# Patient Record
Sex: Male | Born: 1960
Health system: Southern US, Community
[De-identification: ages and names within clinical notes are randomized; demographics above are authoritative.]

## PROBLEM LIST (undated history)

## (undated) DIAGNOSIS — M21379 Foot drop, unspecified foot: Secondary | ICD-10-CM

## (undated) HISTORY — DX: Foot drop, unspecified foot: M21.379

## (undated) HISTORY — PX: LEG SURGERY: SHX1003

## (undated) HISTORY — PX: FINGER SURGERY: SHX640

---

## 2005-12-19 ENCOUNTER — Emergency Department: Payer: Self-pay | Admitting: Emergency Medicine

## 2008-01-05 ENCOUNTER — Ambulatory Visit (HOSPITAL_BASED_OUTPATIENT_CLINIC_OR_DEPARTMENT_OTHER): Admission: RE | Admit: 2008-01-05 | Discharge: 2008-01-05 | Payer: Self-pay | Admitting: Orthopedic Surgery

## 2008-03-05 ENCOUNTER — Emergency Department (HOSPITAL_COMMUNITY): Admission: EM | Admit: 2008-03-05 | Discharge: 2008-03-05 | Payer: Self-pay | Admitting: *Deleted

## 2008-10-21 ENCOUNTER — Ambulatory Visit (HOSPITAL_COMMUNITY): Admission: RE | Admit: 2008-10-21 | Discharge: 2008-10-21 | Payer: Self-pay | Admitting: Neurology

## 2008-10-27 ENCOUNTER — Ambulatory Visit (HOSPITAL_COMMUNITY): Admission: RE | Admit: 2008-10-27 | Discharge: 2008-10-27 | Payer: Self-pay | Admitting: Neurology

## 2008-11-16 ENCOUNTER — Encounter: Admission: RE | Admit: 2008-11-16 | Discharge: 2008-11-16 | Payer: Self-pay | Admitting: Neurology

## 2010-10-16 NOTE — Op Note (Signed)
NAMEROCK, SOBOL                ACCOUNT NO.:  [id number]   MEDICAL RECORD NO.:  [id number]          PATIENT TYPE:  AMB   LOCATION:  DSC                          FACILITY:  [facility]   PHYSICIAN:  Katy Fitch. Sypher, M.D. 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   OPERATION:  Attempted closed reduction and intramedullary fixation of  left long finger distal phalanx mal nonunion followed by open reduction  and internal fixation with intramedullary Micro Acutrak screw.   SURGEON:  Katy Fitch. Sypher, MD   ASSISTANT:  Marveen Reeks Dasnoit, PA-C   ANESTHESIA:  General by LMA.   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   On Oct 27, 2007, he sustained a crushing injury to his left long finger  creating a diaphyseal transverse fracture of the distal phalanx.   He initially did not seek medical attention and attempted home splinting  for approximately 3 weeks.  Mr. Imes constructed a very creative  splint out of Popsicle sticks and duct tape which while immobilizing his  distal phalangeal segment did not obtain or maintain a reduction of the  fracture.   He subsequently presented for evaluation of his finger predicament on  November 18, 2007.  At that time, he was noted to have false motion at the  fracture site, some callus forming, and a bayonet apposition of his  fracture.   I had a lengthy informed consent  with Mr. Dase pointing out that  diaphyseal fracture of the distal phalanx if they are significantly  displaced at times will go onto a fibrous union, nonunion or difficult  malunion.  We embarked on a plan of observing his fracture for 8 weeks  with splinting to see if they would ultimately heal.   He returned on December 16, 2007, with persistent false motion and some  discomfort.  Given this predicament and his need to be quite hands-on at  work, we decided to proceed with either closed or open reduction and  intramedullary fixation anticipating use of a Micro Acutrak screw.   Mr. Ing was advised of the potential risks and benefits of surgery.  We advised him that if we had to open the fracture, we would approach it  from a volar midline so as to minimize any neurovascular injury or nail  injury.  Also pointed out that we would attempt a percutaneous  reduction, however, with 9 weeks of healing of pseudarthrosis we may be  challenged to correct the bayonet apposition.   After informed consent, he was brought to the operating room at this  time.   PROCEDURE:  Tage Feggins is brought to the operating room and placed in  supine position on the operating table.  Following an anesthesia consult  with Dr. Sampson Goon, general anesthesia by LMA technique was recommended  and accepted.   Mr. Spraggins was brought to room 6, placed in a supine position upon the  operating table and under Dr. Jarrett Ables direct supervision, general  anesthesia by LMA technique induced.  The left arm was prepped with  Betadine soap solution and sterilely draped.  Ancef 1 g was administered  as IV prophylactic antibiotic.   The procedure commenced with use of a C-arm fluoroscope to vigorously  manipulate the fracture.  While false motion was present, we are able to  create about 45 degrees of shotgun style angulation of the fracture in  an effort to walk the cortices back into alignment.   Due to a  pseudoarthrosis dorsally and the presence of an intact  fingernail, this was quite challenging to accomplish.   Ultimately, I attempted to use a 0.045-inch Kirschner wire  percutaneously to perform a corticotomy/callus release.  Ultimately  being unsuccessful to correct the translation, we performed a 1 cm  midline incision, placed retractors, released the periosteum and used a  3-mm wide osteotome to create a corticotomy and translation of the  fracture fragments.   A 0.035-inch Kirschner wire was driven through the distal phalangeal  fracture fragment and placed under direct vision across the fracture and  used to lever the intramedullary cortices into alignment.  This was then  driven across the DIP joint in neutral position followed by percutaneous  placement of a 16-mm Micro Acutrak screw.   Excellent compression of the fracture site was achieved and good  position of the fracture and intramedullary fixation device achieved.   AP and lateral C-arm images were obtained documenting satisfactory  reduction and fixation of the fracture.   The K-wire was removed followed by repair of the skin with simple suture  of 5-0 nylon.   A compressive supplied with Alumafoam splint to protect the DIP joint.   For aftercare, Mr. Harbaugh is provided a prescription for Keflex 500 mg  one p.o. q.8 hours x4 days as a prophylactic antibiotic and Percocet 5  mg one p.o. q.4-6 hours p.r.n. pain 30 tablets without refill.  He is  also provided a digital block of 2% lidocaine for immediate  postoperative analgesia.      Katy Fitch Sypher, M.D.  Electronically Signed     RVS/MEDQ  D:  01/05/2008  T:  01/05/2008  Job:  [id number]

## 2011-03-01 LAB — POCT HEMOGLOBIN-HEMACUE: Hemoglobin: 15.2

## 2011-03-05 LAB — DIFFERENTIAL
Basophils Absolute: 0
Basophils Relative: 1
Eosinophils Absolute: 0.4
Eosinophils Relative: 5
Lymphocytes Relative: 18
Lymphs Abs: 1.2
Monocytes Absolute: 0.8
Monocytes Relative: 11
Neutro Abs: 4.6
Neutrophils Relative %: 66

## 2011-03-05 LAB — COMPREHENSIVE METABOLIC PANEL
ALT: 26
AST: 20
Albumin: 3.3 — ABNORMAL LOW
Alkaline Phosphatase: 109
BUN: 18
CO2: 26
Calcium: 8.7
Chloride: 106
Creatinine, Ser: 0.85
GFR calc Af Amer: >60
GFR calc non Af Amer: >60
Glucose, Bld: 104 — ABNORMAL HIGH
Potassium: 3.5
Sodium: 139
Total Bilirubin: 0.9
Total Protein: 5.8 — ABNORMAL LOW

## 2011-03-05 LAB — CBC
HCT: 43.5
Hemoglobin: 14.9
MCHC: 34.3
MCV: 93.2
Platelets: 241
RBC: 4.66
RDW: 12.8
WBC: 7

## 2011-03-05 LAB — POCT CARDIAC MARKERS
CKMB, poc: 1.8
Myoglobin, poc: 47
Troponin i, poc: <0.05

## 2011-03-05 LAB — LIPASE, BLOOD: Lipase: 20

## 2014-05-31 ENCOUNTER — Encounter: Payer: Self-pay | Admitting: Podiatry

## 2014-05-31 ENCOUNTER — Ambulatory Visit (INDEPENDENT_AMBULATORY_CARE_PROVIDER_SITE_OTHER): Payer: Federal, State, Local not specified - PPO

## 2014-05-31 ENCOUNTER — Ambulatory Visit (INDEPENDENT_AMBULATORY_CARE_PROVIDER_SITE_OTHER): Payer: Federal, State, Local not specified - PPO | Admitting: Podiatry

## 2014-05-31 VITALS — BP 132/86 | HR 60 | Resp 16 | Ht 70.0 in | Wt 200.0 lb

## 2014-05-31 DIAGNOSIS — M722 Plantar fascial fibromatosis: Secondary | ICD-10-CM

## 2014-05-31 DIAGNOSIS — M779 Enthesopathy, unspecified: Secondary | ICD-10-CM

## 2014-05-31 NOTE — Progress Notes (Signed)
   Subjective:    Patient ID: Dorrene GermanKevin Nanni, male    DOB: 03/25/1961, 53 y.o.   MRN: [id number]  HPI Comments: i need new orthotics. im not having any foot pain. im tired of taping mine together all the time. In the past i had plantar fasciitis in both feet. i wear my orthotics almost everyday.  Foot Pain      Review of Systems  All other systems reviewed and are negative.      Objective:   Physical Exam        Assessment & Plan:

## 2014-06-01 NOTE — Progress Notes (Signed)
Subjective:     Patient ID: Ralph GermanKevin Goucher, male   DOB: 08/13/1960, 53 y.o.   MRN: [id number]  HPI patient presents after not being seen for a number of years with chronic pain in both feet and long-term orthotic usage with orthotics which have lost the ability to hold his arch up properly   Review of Systems  All other systems reviewed and are negative.      Objective:   Physical Exam  Constitutional: He is oriented to person, place, and time.  Cardiovascular: Intact distal pulses.   Musculoskeletal: Normal range of motion.  Neurological: He is oriented to person, place, and time.  Skin: Skin is warm.  Nursing note and vitals reviewed.  neurovascular status found to be intact with muscle strength adequate and range of motion of the subtalar and midtarsal joint within normal limits. Patient is noted to have discomfort in the plantar feet bilateral that's moderate in intensity with no significant worsening of symptoms. Patient does have moderate depression of the arch upon weightbearing     Assessment:     Tendinitis of the chronic nature that is controlled well with structural orthotic treatment that he has not had fray extended period of time    Plan:     H&P and x-rays reviewed. Today I scanned for new orthotics and reviewed types that we will make him and also physical therapy and supportive shoe gear usage. Reappoint when orthotics returned

## 2014-07-01 ENCOUNTER — Ambulatory Visit: Payer: Federal, State, Local not specified - PPO

## 2015-01-11 ENCOUNTER — Other Ambulatory Visit
Admission: RE | Admit: 2015-01-11 | Discharge: 2015-01-11 | Disposition: A | Discharge status: Home / Self Care | Payer: Federal, State, Local not specified - PPO | Source: Other Acute Inpatient Hospital | Attending: Ophthalmology | Admitting: Ophthalmology

## 2015-01-11 DIAGNOSIS — H16001 Unspecified corneal ulcer, right eye: Secondary | ICD-10-CM | POA: Insufficient documentation

## 2015-01-14 LAB — EYE CULTURE

## 2015-02-02 LAB — CULTURE, FUNGUS WITHOUT SMEAR

## 2015-06-05 ENCOUNTER — Emergency Department
Admission: EM | Admit: 2015-06-05 | Discharge: 2015-06-06 | Disposition: A | Discharge status: Home / Self Care | Payer: Federal, State, Local not specified - PPO | Attending: Emergency Medicine | Admitting: Emergency Medicine

## 2015-06-05 ENCOUNTER — Emergency Department: Payer: Federal, State, Local not specified - PPO

## 2015-06-05 ENCOUNTER — Encounter: Payer: Self-pay | Admitting: Emergency Medicine

## 2015-06-05 DIAGNOSIS — K529 Noninfective gastroenteritis and colitis, unspecified: Secondary | ICD-10-CM | POA: Diagnosis not present

## 2015-06-05 DIAGNOSIS — R1033 Periumbilical pain: Secondary | ICD-10-CM | POA: Diagnosis present

## 2015-06-05 LAB — COMPREHENSIVE METABOLIC PANEL
ALT: 36 U/L (ref 17–63)
AST: 19 U/L (ref 15–41)
Albumin: 3.7 g/dL (ref 3.5–5.0)
Alkaline Phosphatase: 86 U/L (ref 38–126)
Anion gap: 6 (ref 5–15)
BUN: 24 mg/dL — ABNORMAL HIGH (ref 6–20)
CO2: 30 mmol/L (ref 22–32)
Calcium: 9 mg/dL (ref 8.9–10.3)
Chloride: 104 mmol/L (ref 101–111)
Creatinine, Ser: 1.07 mg/dL (ref 0.61–1.24)
GFR calc Af Amer: >60 mL/min (ref >60)
GFR calc non Af Amer: >60 mL/min (ref >60)
Glucose, Bld: 106 mg/dL — ABNORMAL HIGH (ref 65–99)
Potassium: 3.9 mmol/L (ref 3.5–5.1)
Sodium: 140 mmol/L (ref 135–145)
Total Bilirubin: 0.9 mg/dL (ref 0.3–1.2)
Total Protein: 7 g/dL (ref 6.5–8.1)

## 2015-06-05 LAB — CBC WITH DIFFERENTIAL/PLATELET
Basophils Absolute: 0.1 10*3/uL (ref 0–0.1)
Basophils Relative: 1 %
Eosinophils Absolute: 0.4 10*3/uL (ref 0–0.7)
Eosinophils Relative: 5 %
HCT: 45.4 % (ref 40.0–52.0)
Hemoglobin: 15.7 g/dL (ref 13.0–18.0)
Lymphocytes Relative: 22 %
Lymphs Abs: 1.9 10*3/uL (ref 1.0–3.6)
MCH: 31.6 pg (ref 26.0–34.0)
MCHC: 34.6 g/dL (ref 32.0–36.0)
MCV: 91.2 fL (ref 80.0–100.0)
Monocytes Absolute: 0.9 10*3/uL (ref 0.2–1.0)
Monocytes Relative: 11 %
Neutro Abs: 5.5 10*3/uL (ref 1.4–6.5)
Neutrophils Relative %: 61 %
Platelets: 253 10*3/uL (ref 150–440)
RBC: 4.98 MIL/uL (ref 4.40–5.90)
RDW: 13.4 % (ref 11.5–14.5)
WBC: 8.8 10*3/uL (ref 3.8–10.6)

## 2015-06-05 LAB — LIPASE, BLOOD: Lipase: 22 U/L (ref 11–51)

## 2015-06-05 MED ORDER — PROMETHAZINE HCL 25 MG PO TABS: 25.0000 mg | ORAL_TABLET | Freq: Four times a day (QID) | ORAL | Status: DC | PRN

## 2015-06-05 MED ORDER — IOHEXOL 240 MG/ML SOLN
25.0000 mL | Freq: Once | INTRAMUSCULAR | Status: AC | PRN
  Administered 2015-06-05: 25 mL via ORAL

## 2015-06-05 MED ORDER — DICYCLOMINE HCL 20 MG PO TABS: 20.0000 mg | ORAL_TABLET | Freq: Three times a day (TID) | ORAL | Status: DC | PRN

## 2015-06-05 MED ORDER — IOHEXOL 300 MG/ML  SOLN
100.0000 mL | Freq: Once | INTRAMUSCULAR | Status: AC | PRN
  Administered 2015-06-05: 100 mL via INTRAVENOUS

## 2015-06-05 MED ORDER — SODIUM CHLORIDE 0.9 % IV BOLUS (SEPSIS)
500.0000 mL | Freq: Once | INTRAVENOUS | Status: AC
  Administered 2015-06-05: 500 mL via INTRAVENOUS

## 2015-06-05 NOTE — ED Notes (Signed)
Abdominal pain x 4 days.  Pain has been intermittent.  C/o Mid abdominal pain.

## 2015-06-05 NOTE — ED Provider Notes (Addendum)
Time Seen: Approximately 2150  I have reviewed the triage notes  Chief Complaint: Abdominal Pain   History of Present Illness: Ralph GermanKevin Jones is a 55 y.o. male who states he's had a 4 day history of crampy abdominal pain and points mainly superior to the umbilicus level. He states he's had some decreased appetite and occasional nausea with no vomiting. He is not aware of any obvious fever at home but states he's been traveling and was concerned that he may have appendicitis, food poisoning, etc. Denies any continuation of his pain and describes it as crampy intermittent. He states he had some night sweats but no fever that he is aware of. He describes frequent loose watery stool. He denies any melena or hematochezia. He is not aware of any obvious foodborne exposure. He denies being on any recent antibiotics.   History reviewed. No pertinent past medical history.  There are no active problems to display for this patient.   History reviewed. No pertinent past surgical history.  History reviewed. No pertinent past surgical history.  No current outpatient prescriptions on file.  Allergies:  Review of patient's allergies indicates no known allergies.  Family History: No family history on file.  Social History: Social History  Substance Use Topics  . Smoking status: Never Smoker   . Smokeless tobacco: None  . Alcohol Use: 0.0 oz/week    0 Standard drinks or equivalent per week     Review of Systems:   10 point review of systems was performed and was otherwise negative:  Constitutional: No fever Eyes: No visual disturbances ENT: No sore throat, ear pain Cardiac: No chest pain Respiratory: No shortness of breath, wheezing, or stridor Abdomen: Crampy periumbilical abdominal pain without radiation, no vomiting, diarrhea mentioned above Endocrine: No weight loss, No night sweats Extremities: No peripheral edema, cyanosis Skin: No rashes, easy bruising Neurologic: No focal  weakness, trouble with speech or swollowing Urologic: No dysuria, Hematuria, or urinary frequency   Physical Exam:  ED Triage Vitals  Enc Vitals Group     BP 06/05/15 1855 157/102 mmHg     Pulse Rate 06/05/15 1855 64     Resp 06/05/15 1855 18     Temp 06/05/15 1855 98.1 F (36.7 C)     Temp Source 06/05/15 1855 Oral     SpO2 06/05/15 1855 98 %     Weight 06/05/15 1855 200 lb (90.719 kg)     Height 06/05/15 1855 5\' 10"  (1.778 m)     Head Cir --      Peak Flow --      Pain Score 06/05/15 1856 6     Pain Loc --      Pain Edu? --      Excl. in GC? --     General: Awake , Alert , and Oriented times 3; GCS 15 Head: Normal cephalic , atraumatic Eyes: Pupils equal , round, reactive to light Nose/Throat: No nasal drainage, patent upper airway without erythema or exudate.  Neck: Supple, Full range of motion, No anterior adenopathy or palpable thyroid masses Lungs: Clear to ascultation without wheezes , rhonchi, or rales Heart: Regular rate, regular rhythm without murmurs , gallops , or rubs Abdomen: Tenderness mainly around the paraumbilical area without rebound, guarding , or rigidity; bowel sounds positive and symmetric in all 4 quadrants. No organomegaly .    Negative Murphy's sign, negative tenderness over McBurney's point    Extremities: 2 plus symmetric pulses. No edema, clubbing or cyanosis Neurologic: normal  ambulation, Motor symmetric without deficits, sensory intact Skin: warm, dry, no rashes   Labs:   All laboratory work was reviewed including any pertinent negatives or positives listed below:  Labs Reviewed  COMPREHENSIVE METABOLIC PANEL - Abnormal; Notable for the following:    Glucose, Bld 106 (*)    BUN 24 (*)    All other components within normal limits  CBC WITH DIFFERENTIAL/PLATELET  LIPASE, BLOOD   reviewed the patient's laboratory work appears to be a normal limits.   Radiology:  CT of the abdomen is pending at this time to rule out surgical causes for  diverticulitis. I personally reviewed the radiologic studies    ED Course:  Differential diagnosis includes but is not exclusive to acute appendicitis, renal colic, testicular torsion, urinary tract infection, prostatitis,  diverticulitis, small bowel obstruction, colitis, abdominal aortic aneurysm, gastroenteritis, etc.  If the patient's CT is negative as I suspect it will be the patient will be treated on an outpatient basis for viral gastroenteritis with Zofran and Bentyl. Patient's case will be discussed with my colleague who will disposition appropriately and follow results of the CAT scan.    Assessment: Acute unspecified abdominal pain   Final Clinical Impression:  Acute unspecified abdominal pain Final diagnoses:  None     Plan:  Outpatient management if CT is negative   Patient's CAT scan was read as enteritisEXAM: CT ABDOMEN AND PELVIS WITH CONTRAST  TECHNIQUE: Multidetector CT imaging of the abdomen and pelvis was performed using the standard protocol following bolus administration of intravenous contrast.  CONTRAST: OMNIPAQUE IOHEXOL 300 MG/ML SOLN  COMPARISON: None.  FINDINGS: Lower chest: The included lung bases are clear. Tiny calcified granulomas within both lung bases. Calcified mediastinal and hilar adenopathy, partially included.  Liver: No focal lesion.  Hepatobiliary: Gallbladder physiologically distended, no calcified gallstone. No biliary dilatation.  Pancreas: No ductal dilatation or inflammation.  Spleen: Punctate splenic granulomas. Upper limits of normal in size.  Adrenal glands: No nodule.  Kidneys: Symmetric renal enhancement and excretion. No hydronephrosis. Scattered cortical hypodensities in both kidneys, subcentimeter in size, too small to characterize.  Stomach/Bowel: Stomach mildly distended with ingested contrast and contents. Proximal small bowel loops are decompressed. The distal small bowel loops are dilated  and fluid-filled. Mild enhancement of right lower quadrant small bowel loops with mesenteric edema. Small mesenteric lymph nodes. No mesenteric swirling. No pneumatosis. The terminal ileum is difficult to accurately define, however does not appear inflamed. Appendix not confidently identified. Distal colon is decompressed, small amount of stool in the proximal colon.  Vascular/Lymphatic: No retroperitoneal adenopathy. Abdominal aorta is normal in caliber. Mild atherosclerosis without aneurysm.  Reproductive: Prostate gland normal in size  Bladder: Physiologically distended, no wall thickening.  Other: No free air. Small amount of free fluid in the pelvis and right pericolic gutter. No abscess or loculated fluid collection. Fat within both inguinal canals.  Musculoskeletal: There are no acute or suspicious osseous abnormalities.  IMPRESSION: 1. Distended fluid-filled small bowel loops in the right lower abdomen with adjacent mesenteric edema and small lymph nodes. Findings suggest enteritis, may be infectious or inflammatory. No transition point is seen. Early small bowel obstruction could have a similar appearance, but is felt less likely. 2. Sequela of granulomatous disease in the lung bases and spleen.   Electronically Signed  I discharged the patient with a prescription for Bentyl and Zofran.       Jennye Moccasin, MD 06/05/15 1610  Jennye Moccasin, MD 06/05/15 [phone]

## 2015-06-05 NOTE — ED Notes (Signed)
Patient transported to CT 

## 2016-02-08 IMAGING — CT CT ABD-PELV W/ CM
1 of 3 series · 13 of 32 positions shown, 18 images · IV contrast (omnipaque)
Comparison: None.

CLINICAL DATA: Intermittent abdominal pain for 4 days. Diffuse and
mid abdominal pain.

EXAM:
CT ABDOMEN AND PELVIS WITH CONTRAST
TECHNIQUE: Multidetector CT imaging of the abdomen and pelvis was performed
using the standard protocol following bolus administration of
intravenous contrast.
CONTRAST:  100mL OMNIPAQUE IOHEXOL 300 MG/ML  SOLN

[Series 2: routine abd pel with · axial · 0.89mm/px · z∈[-696,-236]mm · 13 of 104 slices shown, 18 images]
[im 6/104  soft-tissue]
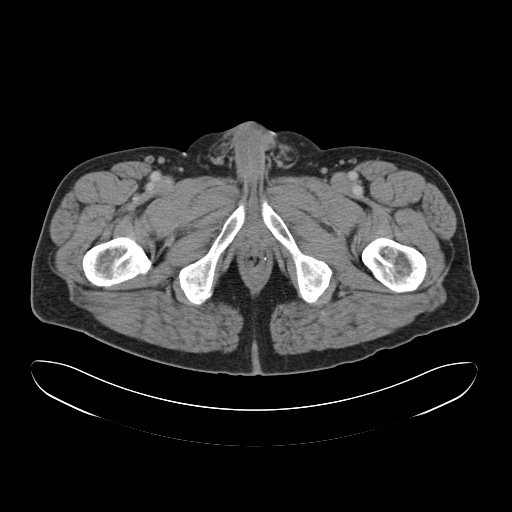
[im 6/104  bone]
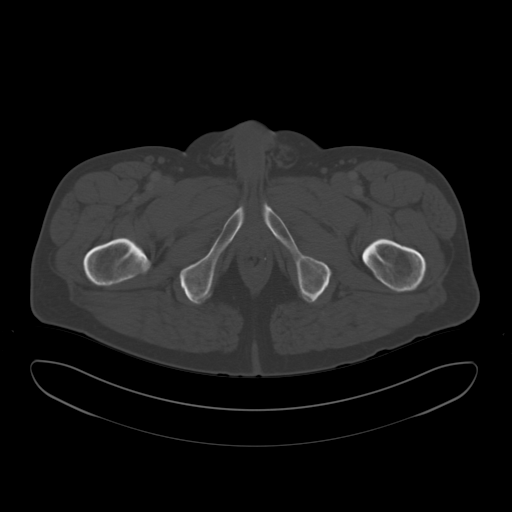
[im 17/104  soft-tissue]
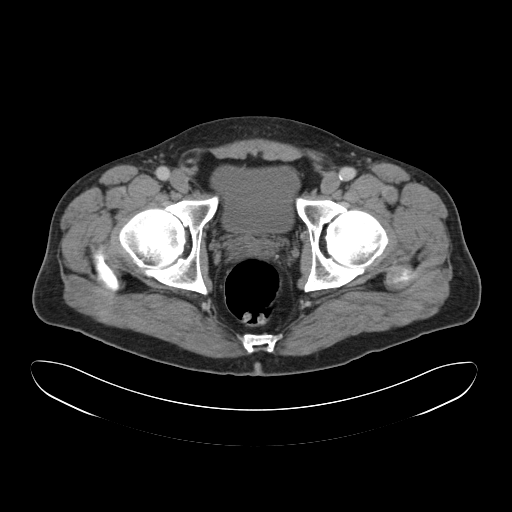
[im 22/104  soft-tissue]
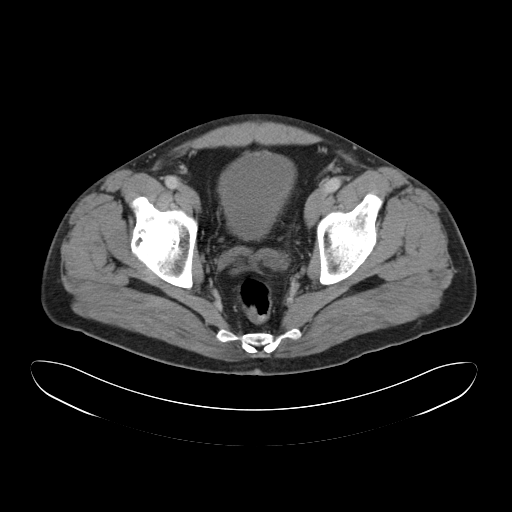
[im 33/104  soft-tissue]
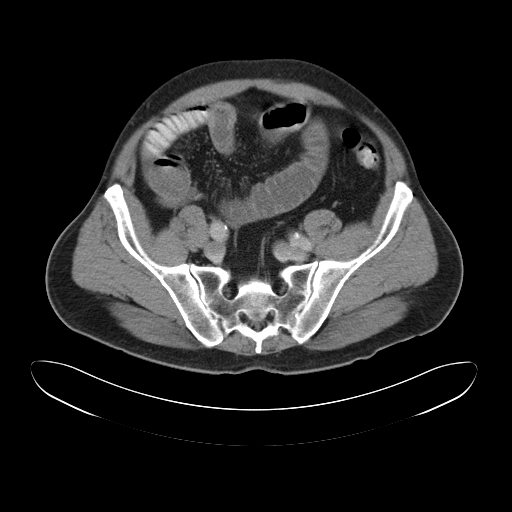
[im 38/104  soft-tissue]
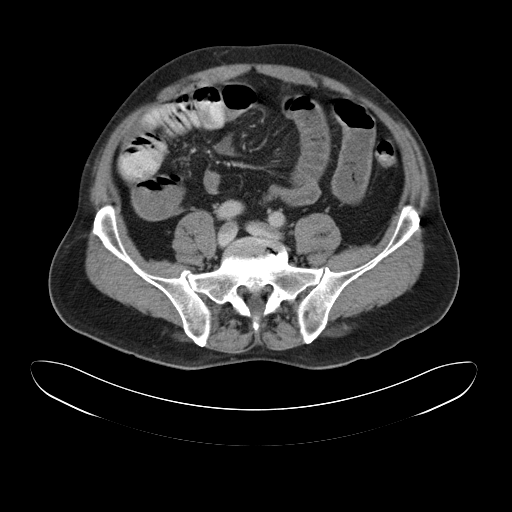
[im 49/104  soft-tissue]
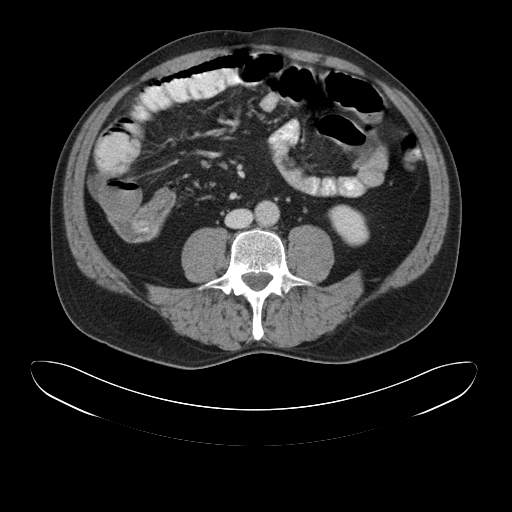
[im 55/104  soft-tissue]
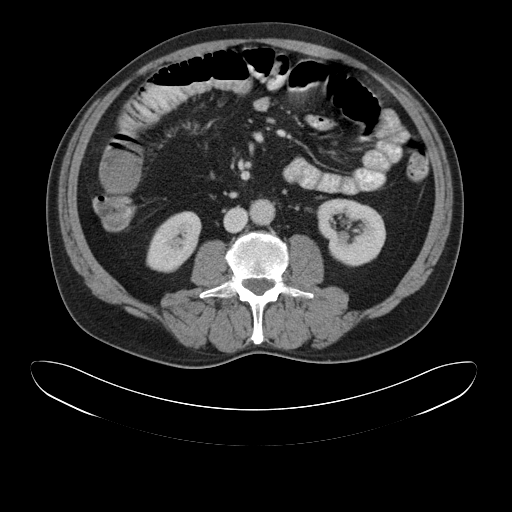
[im 66/104  soft-tissue]
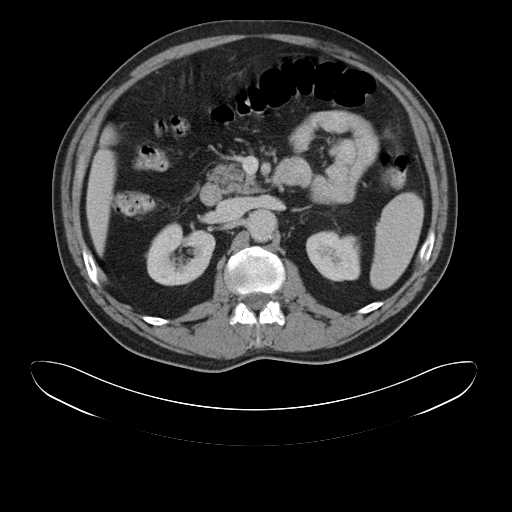
[im 71/104  soft-tissue]
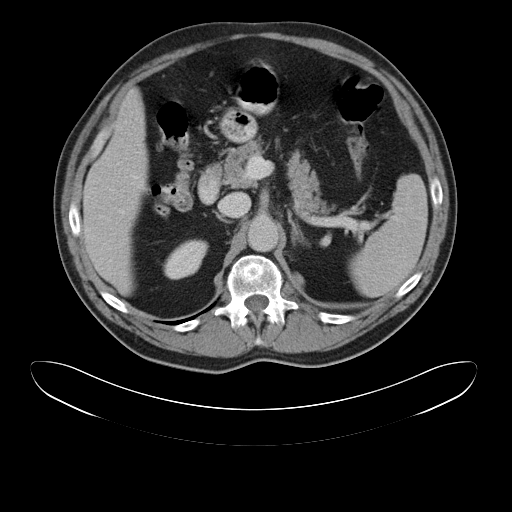
[im 71/104  bone]
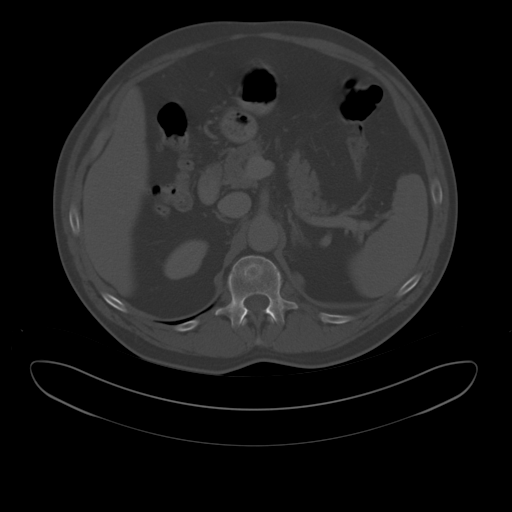
[im 82/104  soft-tissue]
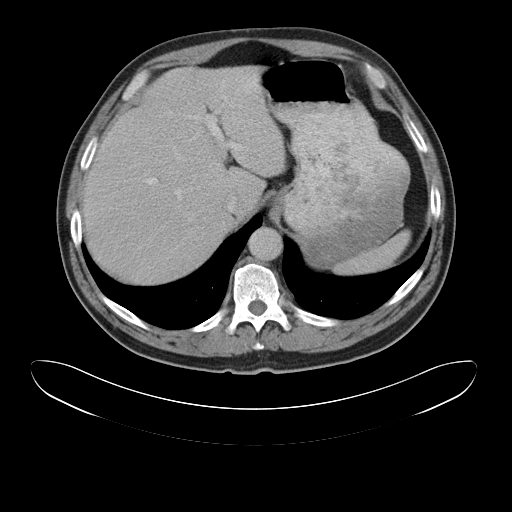
[im 82/104  lung]
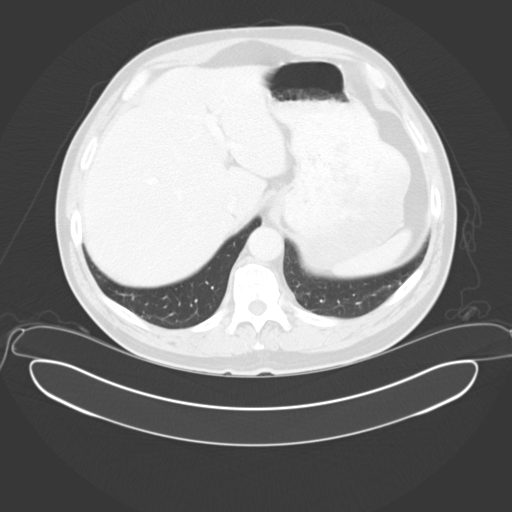
[im 87/104  soft-tissue]
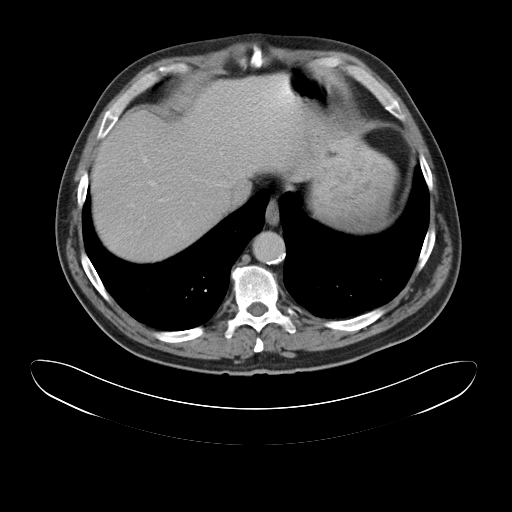
[im 87/104  lung]
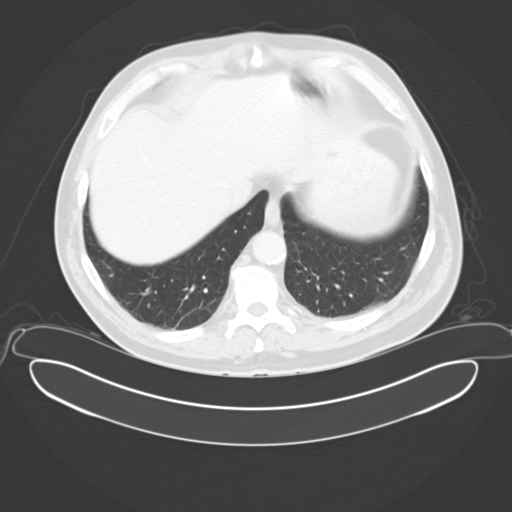
[im 93/104  lung]
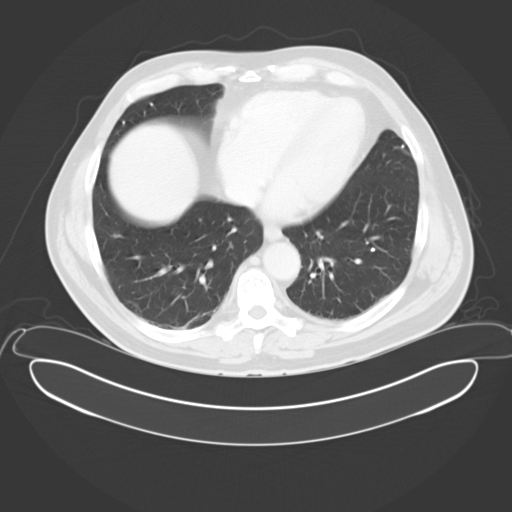
[im 98/104  soft-tissue]
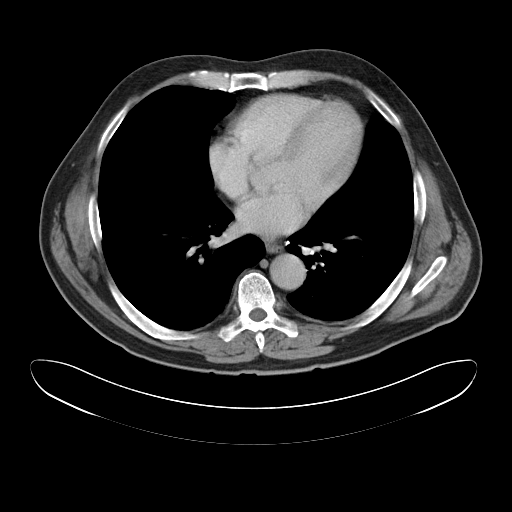
[im 98/104  lung]
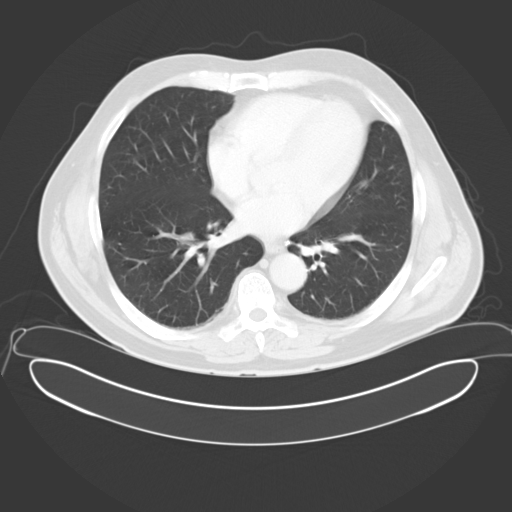

[13 of 32 positions shown; findings below may reference images not displayed]

FINDINGS: Lower chest: The included lung bases are clear. Tiny calcified
granulomas within both lung bases. Calcified mediastinal and hilar
adenopathy, partially included.

Liver: No focal lesion.

Hepatobiliary: Gallbladder physiologically distended, no calcified
gallstone. No biliary dilatation.

Pancreas: No ductal dilatation or inflammation.

Spleen: Punctate splenic granulomas. Upper limits of normal in size.

Adrenal glands: No nodule.

Kidneys: Symmetric renal enhancement and excretion. No
hydronephrosis. Scattered cortical hypodensities in both kidneys,
subcentimeter in size, too small to characterize.

Stomach/Bowel: Stomach mildly distended with ingested contrast and
contents. Proximal small bowel loops are decompressed. The distal
small bowel loops are dilated and fluid-filled. Mild enhancement of
right lower quadrant small bowel loops with mesenteric edema. Small
mesenteric lymph nodes. No mesenteric swirling. No pneumatosis. The
terminal ileum is difficult to accurately define, however does not
appear inflamed. Appendix not confidently identified. Distal colon
is decompressed, small amount of stool in the proximal colon.

Vascular/Lymphatic: No retroperitoneal adenopathy. Abdominal aorta
is normal in caliber. Mild atherosclerosis without aneurysm.

Reproductive: Prostate gland normal in size

Bladder: Physiologically distended, no wall thickening.

Other: No free air. Small amount of free fluid in the pelvis and
right pericolic gutter. No abscess or loculated fluid collection.
Fat within both inguinal canals.

Musculoskeletal: There are no acute or suspicious osseous
abnormalities.
IMPRESSION: 1. Distended fluid-filled small bowel loops in the right lower
abdomen with adjacent mesenteric edema and small lymph nodes.
Findings suggest enteritis, may be infectious or inflammatory. No
transition point is seen. Early small bowel obstruction could have a
similar appearance, but is felt less likely.
2. Sequela of granulomatous disease in the lung bases and spleen.

## 2016-06-17 DIAGNOSIS — K080 Exfoliation of teeth due to systemic causes: Secondary | ICD-10-CM | POA: Diagnosis not present

## 2017-02-04 ENCOUNTER — Encounter (HOSPITAL_COMMUNITY): Payer: Self-pay | Admitting: *Deleted

## 2017-02-04 ENCOUNTER — Ambulatory Visit (HOSPITAL_COMMUNITY)
Admission: EM | Admit: 2017-02-04 | Discharge: 2017-02-04 | Disposition: A | Discharge status: Home / Self Care | Payer: Federal, State, Local not specified - PPO | Attending: Family Medicine | Admitting: Family Medicine

## 2017-02-04 DIAGNOSIS — B880 Other acariasis: Secondary | ICD-10-CM

## 2017-02-04 MED ORDER — PREDNISONE 10 MG (21) PO TBPK: ORAL_TABLET | ORAL | 0 refills | Status: DC

## 2017-02-04 NOTE — ED Triage Notes (Addendum)
Rash  Pt  Developed  A  Rash    While    ouit tside  The  Rash is  Mainly  On his  Legs  The  Rash  Itches  The  Area  Is   Red    With     Lesions   He  Displays  No  Angioedema   And  Is   Sitting  Upright  Speaking in  Complete  sentances

## 2017-02-05 NOTE — ED Provider Notes (Signed)
  [facility]   [id number] 02/04/17 Arrival Time: 1819  ASSESSMENT & PLAN:  1. Chigger bites     Meds ordered this encounter  Medications  . predniSONE (STERAPRED UNI-PAK 21 TAB) 10 MG (21) TBPK tablet    Sig: Take as directed.    Dispense:  21 tablet    Refill:  0   Benadryl if needed. F/U as needed. Reviewed expectations re: course of current medical issues. Questions answered. Outlined signs and symptoms indicating need for more acute intervention. Patient verbalized understanding. After Visit Summary given.   SUBJECTIVE:  Ralph Jones is a 56 y.o. male who presents with complaint of itchy rash on LE bilaterally. Noticed yesterday after being outside in the woods. Afebrile. OTC creams without relief. Questions poison ivy. No specific aggravating or alleviating factors reported.  ROS: As per HPI.   OBJECTIVE:  Vitals:   02/04/17 1922  BP: 132/70  Pulse: 72  Resp: 18  Temp: 98.6 F (37 C)  TempSrc: Oral  SpO2: 100%    General appearance: alert; no distress Extremities: no cyanosis or edema; symmetrical with no gross deformities Skin: lower extremities with scattered erythematous papules; all solitary; no confluence Neurologic: normal gait; normal symmetric reflexes Psychological: alert and cooperative; normal mood and affect   No Known Allergies  Social History   Social History  . Marital status: Divorced    Spouse name: N/A  . Number of children: N/A  . Years of education: N/A   Occupational History  . Not on file.   Social History Main Topics  . Smoking status: Never Smoker  . Smokeless tobacco: Not on file  . Alcohol use 0.0 oz/week  . Drug use: Unknown  . Sexual activity: Not on file   Other Topics Concern  . Not on file   Social History Narrative  . No narrative on file      Ralph Jones, Ralph Firkus, MD 02/05/17 [phone]

## 2017-02-27 DIAGNOSIS — L980 Pyogenic granuloma: Secondary | ICD-10-CM | POA: Diagnosis not present

## 2017-02-27 DIAGNOSIS — D485 Neoplasm of uncertain behavior of skin: Secondary | ICD-10-CM | POA: Diagnosis not present

## 2017-02-27 DIAGNOSIS — R58 Hemorrhage, not elsewhere classified: Secondary | ICD-10-CM | POA: Diagnosis not present

## 2017-02-27 DIAGNOSIS — R234 Changes in skin texture: Secondary | ICD-10-CM | POA: Diagnosis not present

## 2017-03-07 DIAGNOSIS — L980 Pyogenic granuloma: Secondary | ICD-10-CM | POA: Diagnosis not present

## 2017-09-24 ENCOUNTER — Other Ambulatory Visit: Payer: Self-pay

## 2017-09-24 ENCOUNTER — Emergency Department (HOSPITAL_COMMUNITY)
Admission: EM | Admit: 2017-09-24 | Discharge: 2017-09-24 | Disposition: A | Discharge status: Home / Self Care | Attending: Emergency Medicine | Admitting: Emergency Medicine

## 2017-09-24 ENCOUNTER — Encounter (HOSPITAL_COMMUNITY): Payer: Self-pay | Admitting: Emergency Medicine

## 2017-09-24 DIAGNOSIS — S81831A Puncture wound without foreign body, right lower leg, initial encounter: Secondary | ICD-10-CM | POA: Diagnosis not present

## 2017-09-24 DIAGNOSIS — S61452A Open bite of left hand, initial encounter: Secondary | ICD-10-CM

## 2017-09-24 DIAGNOSIS — S61412A Laceration without foreign body of left hand, initial encounter: Secondary | ICD-10-CM | POA: Insufficient documentation

## 2017-09-24 DIAGNOSIS — S81851A Open bite, right lower leg, initial encounter: Secondary | ICD-10-CM

## 2017-09-24 DIAGNOSIS — W540XXA Bitten by dog, initial encounter: Secondary | ICD-10-CM | POA: Diagnosis not present

## 2017-09-24 DIAGNOSIS — Y9389 Activity, other specified: Secondary | ICD-10-CM | POA: Insufficient documentation

## 2017-09-24 DIAGNOSIS — Y990 Civilian activity done for income or pay: Secondary | ICD-10-CM | POA: Insufficient documentation

## 2017-09-24 DIAGNOSIS — S6982XA Other specified injuries of left wrist, hand and finger(s), initial encounter: Secondary | ICD-10-CM | POA: Diagnosis present

## 2017-09-24 DIAGNOSIS — Y9289 Other specified places as the place of occurrence of the external cause: Secondary | ICD-10-CM | POA: Insufficient documentation

## 2017-09-24 DIAGNOSIS — Z23 Encounter for immunization: Secondary | ICD-10-CM | POA: Insufficient documentation

## 2017-09-24 MED ORDER — AMOXICILLIN-POT CLAVULANATE 875-125 MG PO TABS
1.0000 | ORAL_TABLET | Freq: Once | ORAL | Status: AC
  Administered 2017-09-24: 1 via ORAL
  Filled 2017-09-24: qty 1

## 2017-09-24 MED ORDER — LIDOCAINE-EPINEPHRINE (PF) 2 %-1:200000 IJ SOLN
20.0000 mL | Freq: Once | INTRAMUSCULAR | Status: AC
  Administered 2017-09-24: 20 mL
  Filled 2017-09-24: qty 20

## 2017-09-24 MED ORDER — TETANUS-DIPHTH-ACELL PERTUSSIS 5-2.5-18.5 LF-MCG/0.5 IM SUSP
0.5000 mL | Freq: Once | INTRAMUSCULAR | Status: AC
  Administered 2017-09-24: 0.5 mL via INTRAMUSCULAR
  Filled 2017-09-24: qty 0.5

## 2017-09-24 MED ORDER — AMOXICILLIN-POT CLAVULANATE 875-125 MG PO TABS: 1.0000 | ORAL_TABLET | Freq: Two times a day (BID) | ORAL | 0 refills | Status: AC

## 2017-09-24 NOTE — ED Notes (Signed)
ED Provider at bedside. 

## 2017-09-24 NOTE — ED Triage Notes (Signed)
Pt reports being bitten by a dog today at 1400. Wounds to the left hand and right leg. Bleeding controlled at this time. Dogs vaccines up to date per pt.

## 2017-09-24 NOTE — Discharge Instructions (Addendum)
Thank you for allowing me to care for you today in the emergency department.  Please call Dr. Carlos LeveringGramig's office tomorrow morning to schedule a follow-up appointment for reevaluation within the next week.  If he is unable to see you within the next week, if you have a primary care provider you can call and schedule follow-up appointment with them for wound recheck in 2 to 3 days.  If you do not have a primary care provider, please call the number on your discharge paperwork to get established with someone new.  Your sutures need to be removed in 5 to 7 days.  You can have them removed at the hand surgeon's office, if you were able to be seen by your primary care provider, by returning to the emergency department, or if you can be taken out at urgent care.  Take 1 tablet of Augmentin every 12 hours for the next week.  Your first dose was given in the emergency department so start taking this medication tomorrow morning.  Your Tdap vaccination was also updated today.  Take 650 mg of Tylenol or 600 mg of ibuprofen with food every 6 hours for pain control.  Apply ice for 15 to 20 minutes to help with pain and swelling.  To care for your wound at home, clean the area at least once daily with warm water and soap.  It is okay to gently scrub the top of the hands and the lower leg.  After cleaning the area, pat the area dry, then apply a topical antibiotic such as bacitracin or Neosporin directly to the wound.  After applying a topical antibiotic, place a dressing over the area.   Large gauze banding with the brown coban wrap, you can also use a Band-Aid over the area.  If you were using a dressing that does not cover your entire hand, please use the finger splint on your ring finger so that you do not accidentally bend your finger and rip out your stitches.  Make sure to change the dressing at least once daily or anytime you notice that it is soiled or wet.  If you develop any new or worsening symptoms  including if the area around the wound gets red, hot to the touch, or swollen, if you develop fever or chills, red streaking down the arm, or other new concerning symptoms, return to the emergency department for re-evaluation.

## 2017-09-24 NOTE — ED Provider Notes (Signed)
[facility] EMERGENCY DEPARTMENT Provider Note   CSN: [id number] Arrival date & time: 09/24/17  1515     History   Chief Complaint Chief Complaint  Patient presents with  . Animal Bite    HPI Ralph Jones is a 57 y.o. male who presents to the emergency department with a chief complaint of dog bite.  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  The dog bit the dorsum of his left hand and distal right leg.  Animal control was contacted, and he was told that the dog's vaccinations were up-to-date.  He denies numbness, weakness.  Bleeding is controlled with pressure.  Patient is right hand dominant.  No history of left hand or right lower leg surgery or injury.  He is unsure when his tetanus was last updated.  He does not take blood thinners.  No history of diabetes or other immunocompromise exudates.  He is a non-smoker.  The history is provided by the patient. No language interpreter was used.  Animal Bite  Associated symptoms: no numbness and no rash     History reviewed. No pertinent past medical history.  There are no active problems to display for this patient.   History reviewed. No pertinent surgical history.      Home Medications    Prior to Admission medications   Medication Sig Start Date End Date Taking? Authorizing Provider  amoxicillin-clavulanate (AUGMENTIN) 875-125 MG tablet Take 1 tablet by mouth every 12 (twelve) hours for 7 days. 09/24/17 10/01/17  Danford Tat A, PA-C  dicyclomine (BENTYL) 20 MG tablet Take 1 tablet (20 mg total) by mouth 3 (three) times daily as needed for spasms. 06/05/15   Jennye Moccasin, MD  predniSONE (STERAPRED UNI-PAK 21 TAB) 10 MG (21) TBPK tablet Take as directed. 02/04/17   Mardella Layman, MD  promethazine (PHENERGAN) 25 MG tablet Take 1 tablet (25 mg total) by mouth every 6 (six) hours as needed for nausea or vomiting.  06/05/15   Jennye Moccasin, MD    Family History No family history on file.  Social History Social History   Tobacco Use  . Smoking status: Never Smoker  . Smokeless tobacco: Never Used  Substance Use Topics  . Alcohol use: Yes    Alcohol/week: 0.0 oz  . Drug use: Not on file     Allergies   Patient has no known allergies.   Review of Systems Review of Systems  Constitutional: Negative for activity change.  Respiratory: Negative for shortness of breath.   Cardiovascular: Negative for chest pain.  Gastrointestinal: Negative for abdominal pain.  Musculoskeletal: Positive for myalgias. Negative for arthralgias, back pain, gait problem and joint swelling.  Skin: Positive for wound. Negative for color change and rash.  Allergic/Immunologic: Negative for immunocompromised state.  Neurological: Negative for weakness and numbness.   Physical Exam Updated Vital Signs BP (!) 143/96 (BP Location: Right Arm)   Pulse 65   Temp 98.3 F (36.8 C) (Oral)   Resp 16   Ht 5\' 10"  (1.778 m)   Wt 90.7 kg (200 lb)   SpO2 100%   BMI 28.70 kg/m   Physical Exam  Constitutional: He appears well-developed.  HENT:  Head: Normocephalic.  Eyes: Conjunctivae are normal.  Neck: Neck supple.  Cardiovascular: Normal rate and regular rhythm.  No murmur heard. Pulmonary/Chest: Effort normal.  Abdominal: Soft. He exhibits no  distension.  Musculoskeletal: He exhibits tenderness. He exhibits no edema or deformity.  There is a superficial V-shaped with multiple corner flaps that is 3.5 cm in length the dorsum of the left hand that extends transversely across the fourth MCP and distally towards the wrist.  Wound is hemostatic when pressure is applied, but oozes when pressure is removed.  Radial pulses are 2+ and symmetric.  Patient has two-point discrimination along all 4 distal aspects of the third fourth and fifth digits.  Good capillary refill.  Good strength against resistance with flexion,  extension of the fourth digit.  Full active and passive range of motion of the fourth MCP, DIP, and PIP.  There is a punctate wound to the distal right lower leg that is superficial and hemostatic.  No surrounding erythema, edema, warmth.  Neurological: He is alert.  Skin: Skin is warm and dry.  Psychiatric: His behavior is normal.  Nursing note and vitals reviewed.  Left hand    Right distal anterior lower leg    Post initial repair- Most proximal ulnar/lateral suture ripped out  and was replaced with a horizontal mattress.       ED Treatments / Results  Labs (all labs ordered are listed, but only abnormal results are displayed) Labs Reviewed - No data to display  EKG None  Radiology No results found.  Procedures .Marland Kitchen.Laceration Repair Date/Time: 09/24/2017 5:48 PM Performed by: Barkley BoardsMcDonald, Carden Teel A, PA-C Authorized by: Barkley BoardsMcDonald, Falesha Schommer A, PA-C   Consent:    Consent obtained:  Verbal   Consent given by:  Patient   Risks discussed:  Infection, pain and poor wound healing Anesthesia (see MAR for exact dosages):    Anesthesia method:  Local infiltration   Local anesthetic:  Lidocaine 2% WITH epi Laceration details:    Location:  Hand   Hand location:  L hand, dorsum   Length (cm):  3.5 Repair type:    Repair type:  Intermediate Pre-procedure details:    Preparation:  Patient was prepped and draped in usual sterile fashion Exploration:    Hemostasis achieved with:  Direct pressure, epinephrine and tied off vessels   Wound exploration: wound explored through full range of motion and entire depth of wound probed and visualized     Wound extent: vascular damage     Wound extent: no areolar tissue violation noted, no foreign bodies/material noted, no muscle damage noted, no nerve damage noted, no tendon damage noted and no underlying fracture noted   Treatment:    Area cleansed with:  Betadine, saline, Shur-Clens and soap and water   Amount of cleaning:  Extensive    Irrigation solution:  Sterile saline   Irrigation volume:  1 liter    Irrigation method:  Pressure wash and syringe   Visualized foreign bodies/material removed: yes   Skin repair:    Repair method:  Sutures   Suture size:  5-0   Suture material:  Prolene   Number of sutures:  7 Approximation:    Approximation:  Loose Post-procedure details:    Dressing:  Antibiotic ointment, sterile dressing, adhesive bandage and splint for protection   Patient tolerance of procedure:  Tolerated well, no immediate complications Comments:     Patient required hemostasis with a figure 8 absorbable stitch.  6 simple interrupted sutures were initially placed, and a dressing was placed.  I was called back into the room by nursing staff because 1 of the sutures had ripped out when the patient and his finger.  1  simple interrupted suture was removed and was replaced with a horizontal mattress.  The wound was then hemostatic, a gauze dressing and a finger splint was then reapplied.   (including critical care time)  Medications Ordered in ED Medications  lidocaine-EPINEPHrine (XYLOCAINE W/EPI) 2 %-1:200000 (PF) injection 20 mL (20 mLs Infiltration Given 09/24/17 1810)  Tdap (BOOSTRIX) injection 0.5 mL (0.5 mLs Intramuscular Given 09/24/17 1809)  amoxicillin-clavulanate (AUGMENTIN) 875-125 MG per tablet 1 tablet (1 tablet Oral Given 09/24/17 1809)     Initial Impression / Assessment and Plan / ED Course  I have reviewed the triage vital signs and the nursing notes.  Pertinent labs & imaging results that were available during my care of the patient were reviewed by me and considered in my medical decision making (see chart for details).     Patient presents with laceration from a dog bite.  Pt wounds irrigated well with 18ga angiocath with sterile saline.  Wounds examined with visualization of the base and no foreign bodies seen.  Pt Alert and oriented, NAD, nontoxic, nonseptic appearing.  Capillary refill  intact and pt without neurologic deficit. Patient tetanus updated. Pain treated in the emergency department with lidocaine.  Wound was gaping and required closure.  Laceration occurred < 8 hours prior to repair which was well tolerated. Pt has no co morbidities to effect normal wound healing. Discussed suture home care w pt and answered questions. Pt to f-u for wound check and suture removal in 5-7 days. Pt is hemodynamically stable w no complaints prior to dc.    Final Clinical Impressions(s) / ED Diagnoses   Final diagnoses:  Dog bite of left hand, initial encounter  Laceration of left hand without foreign body, initial encounter  Dog bite of right lower leg, initial encounter    ED Discharge Orders        Ordered    amoxicillin-clavulanate (AUGMENTIN) 875-125 MG tablet  Every 12 hours     09/24/17 1751       Esmee Fallaw, Coral Else, PA-C 09/24/17 2009    Marily Memos, MD 09/24/17 2230

## 2017-10-07 ENCOUNTER — Ambulatory Visit: Payer: Federal, State, Local not specified - PPO | Admitting: Family Medicine

## 2017-10-07 ENCOUNTER — Encounter

## 2017-10-07 ENCOUNTER — Encounter: Payer: Self-pay | Admitting: Family Medicine

## 2017-10-07 VITALS — BP 102/68 | HR 64 | Temp 97.7°F | Ht 70.0 in | Wt 208.5 lb

## 2017-10-07 DIAGNOSIS — Z13220 Encounter for screening for lipoid disorders: Secondary | ICD-10-CM

## 2017-10-07 DIAGNOSIS — Z131 Encounter for screening for diabetes mellitus: Secondary | ICD-10-CM

## 2017-10-07 DIAGNOSIS — Z7189 Other specified counseling: Secondary | ICD-10-CM

## 2017-10-07 DIAGNOSIS — W540XXD Bitten by dog, subsequent encounter: Secondary | ICD-10-CM

## 2017-10-07 DIAGNOSIS — Z0000 Encounter for general adult medical examination without abnormal findings: Secondary | ICD-10-CM

## 2017-10-07 NOTE — Patient Instructions (Signed)
Fasting labs as some point, when possible.  I would get a flu shot each fall.   Take care.  Glad to see you.  Update me as needed.

## 2017-10-09 ENCOUNTER — Encounter: Payer: Self-pay | Admitting: Family Medicine

## 2017-10-09 DIAGNOSIS — Z0000 Encounter for general adult medical examination without abnormal findings: Secondary | ICD-10-CM | POA: Insufficient documentation

## 2017-10-09 DIAGNOSIS — Z7189 Other specified counseling: Secondary | ICD-10-CM | POA: Insufficient documentation

## 2017-10-09 DIAGNOSIS — W540XXA Bitten by dog, initial encounter: Secondary | ICD-10-CM | POA: Insufficient documentation

## 2017-10-09 NOTE — Progress Notes (Signed)
New patient.   CPE- See plan.  Routine anticipatory guidance given to patient.  See health maintenance.  The possibility exists that previously documented standard health maintenance information may have been brought forward from a previous encounter into this note.  If needed, that same information has been updated to reflect the current situation based on today's encounter.    Tetanus 2019 Flu encouraged.  PNA and shingles not due yet, d/w pt.  Healthy diet encouraged.  He is walking frequently for exercise.  He walks up to 8 miles per day with work. D/w patient [id number] for colon cancer screening, including IFOB vs. colonoscopy.  Risks and benefits of both were discussed and patient voiced understanding.  Pt elects to consider for now.  We talked about Cologuard also.  He will let me know.  See after visit summary. Prostate cancer screening and PSA options (with potential risks and benefits of testing vs not testing) were discussed along with recent recs/guidelines.  He declined testing PSA at this point. HIV and hepatitis C screening done at the [facility] in 2018 when he gave blood.  Discussed with patient. Advanced care planning.  Daughter Clarisse Gouge designated if patient were incapacitated. Reasonable to check sugar and lipids at some point, and fasting lab appointment.  D/w pt.    Recent dog bite.  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  He had emergency room treatment and then emergency room follow-up for suture removal.  He had a laceration on the left hand and also on the right shin.  Both are healing.  No fevers.  He is gradually getting his range of motion back on the left fourth finger.  Still bandaged today.  PMH and SH reviewed  Meds, vitals, and allergies reviewed.   ROS: Per HPI.  Unless specifically indicated otherwise in HPI, the patient denies:  General: fever. Eyes: acute vision changes ENT: sore throat Cardiovascular: chest  pain Respiratory: SOB GI: vomiting GU: dysuria Musculoskeletal: acute back pain Derm: acute rash Neuro: acute motor dysfunction Psych: worsening mood Endocrine: polydipsia Heme: bleeding Allergy: hayfever  GEN: nad, alert and oriented HEENT: mucous membranes moist NECK: supple w/o LA CV: rrr. PULM: ctab, no inc wob ABD: soft, +bs EXT: no edema SKIN: no acute rash L hand bandaged.  Abrasion on right shin healing with small scab noted. Distally neurovascularly intact on exam of the left hand.

## 2017-10-09 NOTE — Assessment & Plan Note (Addendum)
Tetanus 2019 Flu encouraged.  PNA and shingles not due yet, d/w pt.  Healthy diet encouraged.  He is walking frequently for exercise.  He walks up to 8 miles per day with work. D/w patient [id number] for colon cancer screening, including IFOB vs. colonoscopy.  Risks and benefits of both were discussed and patient voiced understanding.  Pt elects to consider for now.  We talked about Cologuard also.  He will let me know.  See after visit summary. Prostate cancer screening and PSA options (with potential risks and benefits of testing vs not testing) were discussed along with recent recs/guidelines.  He declined testing PSA at this point. HIV and hepatitis C screening done at the [facility] in 2018 when he gave blood.  Discussed with patient. Advanced care planning.  Daughter Ralph Jones designated if patient were incapacitated. Reasonable to check sugar and lipids at some point, and fasting lab appointment.  D/w pt.

## 2017-10-09 NOTE — Assessment & Plan Note (Signed)
Appears to be healing well.  No fevers.  Update me as needed.  Routine cautions given.  He agrees.

## 2017-10-09 NOTE — Assessment & Plan Note (Signed)
Advanced care planning.  Daughter Clarisse Gouge designated if patient were incapacitated.

## 2017-11-05 DIAGNOSIS — H43811 Vitreous degeneration, right eye: Secondary | ICD-10-CM | POA: Diagnosis not present

## 2018-02-16 DIAGNOSIS — H43811 Vitreous degeneration, right eye: Secondary | ICD-10-CM | POA: Diagnosis not present

## 2018-12-01 ENCOUNTER — Other Ambulatory Visit: Payer: Self-pay

## 2018-12-01 ENCOUNTER — Encounter: Payer: Self-pay | Admitting: Family Medicine

## 2018-12-01 ENCOUNTER — Ambulatory Visit: Payer: Federal, State, Local not specified - PPO | Admitting: Family Medicine

## 2018-12-01 ENCOUNTER — Telehealth: Payer: Self-pay | Admitting: Family Medicine

## 2018-12-01 VITALS — BP 130/66 | HR 60 | Temp 98.0°F | Ht 70.0 in | Wt 204.3 lb

## 2018-12-01 DIAGNOSIS — F4321 Adjustment disorder with depressed mood: Secondary | ICD-10-CM | POA: Insufficient documentation

## 2018-12-01 NOTE — Telephone Encounter (Signed)
Please triage patient. See below.  Thanks.   Colon Branch, MD  Tonia Ghent, MD; Colon Branch, MD        Good morning, your patient called the answering service last night, he reported depression and suicidal ideas, declined ER evaluation, information about the suicide prevention hotline provided. He said he will call your office this morning. Please follow-up

## 2018-12-01 NOTE — Telephone Encounter (Signed)
Noted. Thanks.

## 2018-12-01 NOTE — Telephone Encounter (Signed)
I spoke with pt; pt did not go to UC or ED last night; today pt said he feels tired and wants the stress to go away. Pt has had a lot on plate last 3 years. Tired of others taking advantage of him. He has to cut ties and that hurts. No SI/HI today. He knows "this boy" is going to be leaving today. Pt refusing ED and request appt to see Dr Damita Dunnings. Dr Damita Dunnings said could see pt today at 11:30 AM. Pt will be back from [place] today around 10 AM. ED precautions given and pt voiced understanding. No covid symptoms, no travel and no exposure to + covid. FYI to Dr Damita Dunnings.

## 2018-12-01 NOTE — Patient Instructions (Signed)
Update me after the counseling appointment.  I want to know how you are doing.  Take care.  Glad to see you.

## 2018-12-01 NOTE — Telephone Encounter (Signed)
[facility] - Client TELEPHONE ADVICE RECORD AccessNurse Patient Name: Ralph GermanKEVIN Jones Gender: Male DOB: 05/19/1961 Age: 58 Y 1 M 25 D Return Phone Number: [phone] (Primary) Address: [place]Zip: [place] [id number] Client [facility] - Client [facility] - Night Physician AA - PHYSICIAN, Crissie FiguresUNKNOWN- MD Contact Type Call Who Is Calling Patient / Member / Family / Caregiver Call Type Triage / Clinical Relationship To Patient Self Return Phone Number [phone] (Primary) Chief Complaint SUICIDE - threatening harm to self or others Reason for Call Symptomatic / Request for Health Information Initial Comment Caller states he wants to know if there is some anti-depressed medication he can take. Hes having thoughts about hurting himself or someone else. Translation No Nurse Assessment Nurse: Mauro KaufmannHalpin, RN, Randa EvensJacklyn Date/Time (Eastern Time): 11/30/2018 5:16:13 PM Confirm and document reason for call. If symptomatic, describe symptoms. ---Caller states that he is really going through a lot and needs help. Has been in bad relationships x3 years and has been dealing with extra pressure that has been overwhelming. Sometimes he doesn't feel like he wants to be alive. Wants to know if there are any antidepressant medications, maybe something mild that he can be on. He is tried of crying everyday and he is tired and drained from the stress. " thinking of going out and hanging myself". Has the patient had close contact with a person known or suspected to have the novel coronavirus illness OR traveled / lives in area with major community spread (including international travel) in the last 14 days from the onset of symptoms? * If Asymptomatic, screen for exposure and travel within the last 14 days. ---No Does the patient have any new or worsening symptoms? ---Yes Will a triage be completed?  ---Yes Related visit to physician within the last 2 weeks? ---No Does the PT have any chronic conditions? (i.e. diabetes, asthma, this includes High risk factors for pregnancy, etc.) ---No Is this a behavioral health or substance abuse call? ---Yes Are you having any thoughts or feelings of harming or killing yourself or someone else? ---Yes Do you have a weapon with you? ---No Are you alone? ---Yes Enter any comments. 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 PLEASE NOTE: All timestamps contained within this report are represented as [place] Standard Time. CONFIDENTIALTY NOTICE: This fax transmission is intended only for the addressee. It contains information that is legally privileged, confidential or otherwise protected from use or disclosure. If you are not the intended recipient, you are strictly prohibited from reviewing, disclosing, copying using or disseminating any of this information or taking any action in reliance on or regarding this information. If you have received this fax in error, please notify us immediately by telephone so that we can arrange for its return to us. Phone: [phone], Toll-Free: [phone], Fax: [phone] Page: 2 of 3 Call Id: [id number] Nurse Assessment Are you currently experiencing any physical discomfort that you think may be related to the use of alcohol or other drugs? (use substance abuse or alcohol abuse guidelines. These include withdrawal symptoms) ---No Do you worry that you may be hearing or seeing things that others do not? ---No Do you take medications for your condition(s)? ---No Guidelines Guideline Title Affirmed Question Affirmed Notes Nurse Date/Time (Eastern Time) Suicide Concerns [1] Depression symptoms (sadness, hopelessness, decreased energy) AND [2] unable to do any normal activities (e.g., self care, school, work; in comparison to baseline). Mauro KaufmannHalpin, RN, Randa EvensJacklyn 11/30/2018  5:24:08 PM Disp. Time Eilene Ghazi  Time) Disposition Final User 11/30/2018 5:12:19 PM Send to Urgent Gretchen Portela 11/30/2018 5:45:20 PM Called On-Call Provider Pole Ojea, RN, Malachi Paradise 11/30/2018 5:49:57 PM Paged On Call back to [facility], [place], Malachi Paradise 11/30/2018 5:51:02 PM Paged On Call back to [facility], [place], Malachi Paradise 11/30/2018 5:50:31 PM Go to ED Now Yes Freddie Apley, RN, Lavonna Monarch Disagree/Comply Disagree Caller Understands Yes PreDisposition InappropriateToAsk Care Advice Given Per Guideline GO TO ED NOW: * You need to be seen in the Emergency Department. * Go to the ED at ___________ [place] now. Drive carefully. CARE ADVICE given per Suicide Concerns (Adult) guideline. Referrals GO TO FACILITY UNDECIDED Paging DoctorName Phone DateTime Result/Outcome Message Type Notes Kathlene November - MD [id number] 11/30/2018 5:45:20 PM Called On Call Provider - Reached Doctor Paged Kathlene November - MD 11/30/2018 5:46:15 PM Spoke with On Call - General Message Result provider stated tht he was unable to find the patient in the system. The provider stated that he would need to see the patient in the ED or in clinic to perscirbs an anti depressant. PLEASE NOTE: All timestamps contained within this report are represented as [place] Standard Time. CONFIDENTIALTY NOTICE: This fax transmission is intended only for the addressee. It contains information that is legally privileged, confidential or otherwise protected from use or disclosure. If you are not the intended recipient, you are strictly prohibited from reviewing, disclosing, copying using or disseminating any of this information or taking any action in reliance on or regarding this information. If you have received this fax in error, please notify us immediately by telephone so that we can arrange for its return to [place]. Phone: [phone], Toll-Free: [phone], Fax: [phone] Page: 3 of 3 Call Id: [id number] [place] Phone DateTime  Result/Outcome Message Type Notes Kathlene November - MD [id number] 11/30/2018 5:49:56 PM Paged On Call Back to Call Center Doctor Paged [place]: [phone] EXT: [phone] I just called you regarding the patient who was saying he was going to commit suicide. He refused to be seen tonight, he said that he would call in the morning to [place]. I told him that he needed to take care of this tonight and he said he wouldn't harm himself tonight. His last name was misspelled, the correct spelling is Ellerbe. I gave him the information to the hot lines that you had given me. Kathlene November - MD 11/30/2018 6:30:25 PM Spoke with On Call - General Message Result

## 2018-12-01 NOTE — Progress Notes (Signed)
In a relationship for 3.5 years with his girlfriend.  She moved in with him prev.  They had different goals and they grew apart.  In the last 1.5 years "it's been a roller coaster."  He has asked her to move out multiple times, she left May 23rd of this year.  His girlfriend's son had an acquaintance from school (who immigrated from [place]) and he has been living with him.  That dependent is going to move in with another resettled refugee family.    He is trying to manage a lot of stressors.  He has tried to help his dependent as described above.  He is tearful about the whole situation.  All of this is on top of the pandemic.  He is still working.    When patient goes home, he'll be alone with his dog and cat.  No SI/HI.  He contracts for safety.    No illicits.  No etoh.  He is going to see a counselor tomorrow with his EAP.  He is going to follow through with that, I encouraged that.    He has plans to see his daughter and his grandkids this weekend, she is supportive.    Meds, vitals, and allergies reviewed.   ROS: Per HPI unless specifically indicated in ROS section   GEN: nad, alert and oriented HEENT: Normocephalic atraumatic NECK: supple w/o LA CV: rrr PULM: ctab, no inc wob ABD: soft, +bs EXT: no edema SKIN: Well-perfused Speech is fluent and judgment appears intact.  No tremor.

## 2018-12-02 ENCOUNTER — Telehealth: Payer: Self-pay | Admitting: Family Medicine

## 2018-12-02 NOTE — Assessment & Plan Note (Signed)
No suicidal or homicidal intent.  No illicit use.  Still okay for outpatient follow-up.  He has follow-up pending with counseling through EAP.  I think this is reasonable.  He was not yet at the point of wanting to start medication.  Discussed options.  I want him to get the counseling appointment and then let me know how he is doing.  We talked about options and he agrees with this plan.  Routine cautions given. >25 minutes spent in face to face time with patient, >50% spent in counselling or coordination of care.

## 2018-12-02 NOTE — Telephone Encounter (Signed)
Please get update on patient.  I want to know how he is doing after his appointment with counseling/EAP.  Thanks.

## 2018-12-03 ENCOUNTER — Ambulatory Visit: Payer: Federal, State, Local not specified - PPO | Admitting: Family Medicine

## 2018-12-03 NOTE — Telephone Encounter (Signed)
Patient says he hasn't had an appt with anybody, still trying to get something set up.  Patient says today is a pretty good day.

## 2018-12-05 NOTE — Telephone Encounter (Signed)
Noted.  Please check with patient at the end of the upcoming week, ie ~12/10/2018 about his mood and using EAP.  Thanks.

## 2018-12-11 NOTE — Telephone Encounter (Signed)
Spoke with patient. Patient states he is doing better. Tried to seek EAP counseling through work but everyone is booked up right now. Just working and keeping busy at this time.

## 2018-12-13 NOTE — Telephone Encounter (Signed)
Noted. Thanks.  Glad he is doing better.   

## 2020-06-26 ENCOUNTER — Telehealth: Payer: Self-pay

## 2020-06-26 NOTE — Telephone Encounter (Signed)
[facility] Night - Client TELEPHONE ADVICE RECORD AccessNurse Patient Name: Ralph Jones Gender: Male DOB: 1961-01-08 Age: 60 Y 8 M 18 D Return Phone Number: [phone] (Primary) Address: City/State/Zip: [place] [address] Client [employer] [facility] Night - Client Client Site [facility] - Night Physician Raechel Ache - MD Contact Type Call Who Is Calling Patient / Member / Family / Caregiver Call Type Triage / Clinical Relationship To Patient Self Return Phone Number [phone] (Primary) Chief Complaint Chest Pain (non urgent symptoms) Reason for Call Symptomatic / Request for Health Information Initial Comment Caller states he tested positive for Covid a couple of weeks ago and his chest hurts. He is coughing up milky white phlegm. He feels itchy in his back. Feels fatigued. Translation No Nurse Assessment Nurse: Hammonds, RN, Lissa Date/Time (Eastern Time): 06/24/2020 11:30:31 AM Confirm and document reason for call. If symptomatic, describe symptoms. ---Caller states: I tested positive for Covid a couple of weeks ago. I am feeling chest hurting , like phlegm in chest. I am coughing up milky white phlegm. He feels itchy in his back. Feeling fatigued. Does the patient have any new or worsening symptoms? ---Yes Will a triage be completed? ---Yes Related visit to physician within the last 2 weeks? ---No Does the PT have any chronic conditions? (i.e. diabetes, asthma, this includes High risk factors for pregnancy, etc.) ---No Is this a behavioral health or substance abuse call? ---No Guidelines Guideline Title Affirmed Question Affirmed Notes Nurse Date/Time (Eastern Time) COVID-19 - Diagnosed or Suspected Chest pain or pressure Hammonds, RN, Lissa 06/24/2020 11:33:25 AM Disp. Time Lamount Cohen Time) Disposition Final User 06/24/2020 11:36:24 AM Go to ED Now (or PCP triage) Yes Hammonds, RN, Alta Corning Caller  Disagree/Comply Comply Caller Understands Yes PLEASE NOTE: All timestamps contained within this report are represented as [place] Standard Time. CONFIDENTIALTY NOTICE: This fax transmission is intended only for the addressee. It contains information that is legally privileged, confidential or otherwise protected from use or disclosure. If you are not the intended recipient, you are strictly prohibited from reviewing, disclosing, copying using or disseminating any of this information or taking any action in reliance on or regarding this information. If you have received this fax in error, please notify us immediately by telephone so that we can arrange for its return to [place]. Phone: [phone], Toll-Free: [phone], Fax: [phone] Page: 2 of 2 Call Id: [id number] PreDisposition Did not know what to do Care Advice Given Per Guideline GO TO ED NOW (OR PCP TRIAGE): * IF NO PCP (PRIMARY CARE PROVIDER) SECOND-LEVEL TRIAGE: You need to be seen within the next hour. Go to the ED/UCC at _____________ Hospital. Leave as soon as you can. * Cough: Use cough drops. CARE ADVICE given per COVID-19 - DIAGNOSED OR SUSPECTED (Adult) guideline. Referrals [facility] [place] [facility]

## 2020-06-26 NOTE — Telephone Encounter (Signed)
Thanks for checking on patient.  Please try to call tomorrow.

## 2020-06-26 NOTE — Telephone Encounter (Signed)
Attempted to call and speak with patient, but voicemail has not been set up.

## 2020-06-27 NOTE — Telephone Encounter (Signed)
Spoke with patient and he did test positive for covid and was out of work for about a week and a half; went back to work today. Patient still very tired and still has a slight cough but doing better.

## 2020-06-28 NOTE — Telephone Encounter (Signed)
Noted.  Glad to hear that he is improving.  Thanks.

## 2021-05-01 ENCOUNTER — Other Ambulatory Visit: Payer: Self-pay

## 2021-05-01 ENCOUNTER — Ambulatory Visit (INDEPENDENT_AMBULATORY_CARE_PROVIDER_SITE_OTHER): Payer: Federal, State, Local not specified - PPO | Admitting: Podiatry

## 2021-05-01 ENCOUNTER — Ambulatory Visit (INDEPENDENT_AMBULATORY_CARE_PROVIDER_SITE_OTHER): Payer: Federal, State, Local not specified - PPO

## 2021-05-01 DIAGNOSIS — M7752 Other enthesopathy of left foot: Secondary | ICD-10-CM

## 2021-05-01 DIAGNOSIS — M79672 Pain in left foot: Secondary | ICD-10-CM

## 2021-05-03 ENCOUNTER — Encounter: Payer: Self-pay | Admitting: Podiatry

## 2021-05-03 NOTE — Progress Notes (Signed)
  Subjective:  Patient ID: Ralph Jones, male    DOB: Nov 29, 1960,  MRN: [id number]  Chief Complaint  Patient presents with   Foot Pain    Left foot pain on the top of the foot     60 y.o. male presents with the above complaint.  Patient presents with complaint left ankle pain.  Patient states that sharp shooting pain that goes through the top of the foot sometimes.  Patient states is painful to touch.  Painful with ambulation.  He has not seen anyone as prior to seeing me.  Pain scale 7 out of 10 hurts with walking.  He has tried taking some anti-inflammatory which helped some.  He would like to discuss treatment options for this.  He has not gone injection in the past.  He denies any other acute concerns   Review of Systems: Negative except as noted in the HPI. Denies N/V/F/Ch.  Past Medical History:  Diagnosis Date   Foot drop    History of, resolved, previous surgical procedure to excise the cyst compressing a peripheral nerve in the right lower leg    Current Outpatient Medications:    amoxicillin-clavulanate (AUGMENTIN) 875-125 MG tablet, amoxicillin 875 mg-potassium clavulanate 125 mg tablet, Disp: , Rfl:   Social History   Tobacco Use  Smoking Status Never  Smokeless Tobacco Never    No Known Allergies Objective:  There were no vitals filed for this visit. There is no height or weight on file to calculate BMI. Constitutional Well developed. Well nourished.  Vascular Dorsalis pedis pulses palpable bilaterally. Posterior tibial pulses palpable bilaterally. Capillary refill normal to all digits.  No cyanosis or clubbing noted. Pedal hair growth normal.  Neurologic Normal speech. Oriented to person, place, and time. Epicritic sensation to light touch grossly present bilaterally.  Dermatologic Nails well groomed and normal in appearance. No open wounds. No skin lesions.  Orthopedic: Pain on palpation left ankle joint.  Pain with dorsiflexion of the ankle joint mild pain  with plantarflexion of the ankle joint.  Pain along the course of the anterior ankle joint medial and lateral gutter.  Medial little bit greater than lateral.  No pain at the Achilles tendon, peroneal tendon, posterior tibial, ATFL   Radiographs: 3 views of skeletally mature adult left foot: Osteoarthritic changes noted to the anterior ankle joint with spurring/osteophytes at the anterior ankle joint.  The posterior ankle joint seems to be within normal limits.  No other bony abnormalities identified Pat noted.  Mild midfoot arthritis noted. Assessment:   1. Capsulitis of left ankle    Plan:  Patient was evaluated and treated and all questions answered.  Left ankle joint arthritis with underlying capsulitis -I explained the patient the etiology of arthritis and restaurant options were extensively discussed.  Given the amount of pain that he is having I believe he will benefit from a steroid injection of decrease acute inflammatory component associated pain.  Patient agrees with plan like to proceed with steroid injection. -A steroid injection was performed at left ankle joint using 1% plain Lidocaine and 10 mg of Kenalog. This was well tolerated. -If there is no improvement we will discuss cam boot immobilization versus ankle arthroscopy.    No follow-ups on file.

## 2021-05-31 ENCOUNTER — Ambulatory Visit (INDEPENDENT_AMBULATORY_CARE_PROVIDER_SITE_OTHER): Payer: Federal, State, Local not specified - PPO | Admitting: Podiatry

## 2021-05-31 ENCOUNTER — Other Ambulatory Visit: Payer: Self-pay

## 2021-05-31 DIAGNOSIS — M7752 Other enthesopathy of left foot: Secondary | ICD-10-CM

## 2021-06-05 NOTE — Progress Notes (Signed)
°  Subjective:  Patient ID: Ralph Jones, male    DOB: 06-22-1960,  MRN: [id number]  Chief Complaint  Patient presents with   Foot Pain    Pt stated that he is still having some pain but it is not as bad     61 y.o. male presents with the above complaint.  Patient presents for follow-up to left ankle capsulitis.  He states a steroid injection helps and is doing a lot better.  He would like to know if he should do another 1.  He is about 70 to 80% improved.  He denies any other acute complaints.  He would like to discuss next treatment plans.   Review of Systems: Negative except as noted in the HPI. Denies N/V/F/Ch.  Past Medical History:  Diagnosis Date   Foot drop    History of, resolved, previous surgical procedure to excise the cyst compressing a peripheral nerve in the right lower leg    Current Outpatient Medications:    amoxicillin-clavulanate (AUGMENTIN) 875-125 MG tablet, amoxicillin 875 mg-potassium clavulanate 125 mg tablet, Disp: , Rfl:   Social History   Tobacco Use  Smoking Status Never  Smokeless Tobacco Never    No Known Allergies Objective:  There were no vitals filed for this visit. There is no height or weight on file to calculate BMI. Constitutional Well developed. Well nourished.  Vascular Dorsalis pedis pulses palpable bilaterally. Posterior tibial pulses palpable bilaterally. Capillary refill normal to all digits.  No cyanosis or clubbing noted. Pedal hair growth normal.  Neurologic Normal speech. Oriented to person, place, and time. Epicritic sensation to light touch grossly present bilaterally.  Dermatologic Nails well groomed and normal in appearance. No open wounds. No skin lesions.  Orthopedic: Pain on palpation left ankle joint.  Pain with dorsiflexion of the ankle joint mild pain with plantarflexion of the ankle joint.  Pain along the course of the anterior ankle joint medial and lateral gutter.  Medial little bit greater than lateral.  No pain  at the Achilles tendon, peroneal tendon, posterior tibial, ATFL   Radiographs: 3 views of skeletally mature adult left foot: Osteoarthritic changes noted to the anterior ankle joint with spurring/osteophytes at the anterior ankle joint.  The posterior ankle joint seems to be within normal limits.  No other bony abnormalities identified Pat noted.  Mild midfoot arthritis noted. Assessment:   1. Capsulitis of left ankle     Plan:  Patient was evaluated and treated and all questions answered.  Left ankle joint arthritis with underlying capsulitis -I explained the patient the etiology of arthritis and restaurant options were extensively discussed.  Given the amount of pain that he is having I believe he will benefit from a steroid injection of decrease acute inflammatory component associated pain.  Patient agrees with plan like to proceed with steroid injection. -A second steroid injection was performed at left ankle joint using 1% plain Lidocaine and 10 mg of Kenalog. This was well tolerated. -If there is no improvement we will discuss cam boot immobilization versus ankle arthroscopy.    No follow-ups on file.

## 2022-08-20 ENCOUNTER — Ambulatory Visit: Payer: Federal, State, Local not specified - PPO | Admitting: Family Medicine

## 2022-08-20 ENCOUNTER — Encounter: Payer: Self-pay | Admitting: Family Medicine

## 2022-08-20 VITALS — BP 120/64 | HR 65 | Temp 97.3°F | Ht 70.0 in | Wt 216.0 lb

## 2022-08-20 DIAGNOSIS — Z1211 Encounter for screening for malignant neoplasm of colon: Secondary | ICD-10-CM

## 2022-08-20 DIAGNOSIS — H7291 Unspecified perforation of tympanic membrane, right ear: Secondary | ICD-10-CM | POA: Diagnosis not present

## 2022-08-20 DIAGNOSIS — R059 Cough, unspecified: Secondary | ICD-10-CM

## 2022-08-20 NOTE — Patient Instructions (Addendum)
When feeling better, gently try to pop your ears.  If you can't get both ear drums to move, then let me know.   Update me as needed.  The cough should gradually get better.  Take care.  Glad to see you.  Let me know if you don't get called by GI.  Schedule a physical for the fall, when possible.

## 2022-08-20 NOTE — Progress Notes (Unsigned)
He is living alone.  Still working.  Discussed.  He had a temp up to 102.  Was out of work, then had a cough.  Cough would relapse and remit.  He feels better today.  Taking mucinex and dayquil.  Cough is slowly getting better now.  Last fever was 2 weeks ago.  1st fever was about ~6 weeks ago.  He had positive covid test initially.  He was getting better then had return of fevers.  Then again with 3rd episode.  No vomiting, no diarrhea.  Cough is the last sx to resolve.  Sputum is creamy.  No wheeze now, but some prev.   D/w patient [id number] for colon cancer screening, including IFOB vs. colonoscopy.  Risks and benefits of both were discussed and patient voiced understanding.  Pt elects for colonoscopy.    Meds, vitals, and allergies reviewed.   ROS: Per HPI unless specifically indicated in ROS section   GEN: nad, alert and oriented HEENT: ncat NECK: supple w/o LA CV: rrr. PULM: ctab, no inc wob ABD: soft, +bs EXT: no edema SKIN: no acute rash R tm with chronic appearing perforation L tm wnl.   No tympanic membrane movement in either ear with Valsalva maneuver.

## 2022-08-21 DIAGNOSIS — Z1211 Encounter for screening for malignant neoplasm of colon: Secondary | ICD-10-CM | POA: Insufficient documentation

## 2022-08-21 DIAGNOSIS — R059 Cough, unspecified: Secondary | ICD-10-CM | POA: Insufficient documentation

## 2022-08-21 DIAGNOSIS — H7290 Unspecified perforation of tympanic membrane, unspecified ear: Secondary | ICD-10-CM | POA: Insufficient documentation

## 2022-08-21 NOTE — Assessment & Plan Note (Signed)
He previously tested positive for COVID.  He likely had 3 separate episodes with fevers.  He feels better in the meantime and his lungs are clear.  He is improving.  He could have had some combination of COVID versus RSV versus flu versus other self-limited viral infection in succession.  Discussed.  Reasonable for observation in the meantime.  Update me as needed.

## 2022-08-21 NOTE — Assessment & Plan Note (Signed)
Refer to GI 

## 2022-08-21 NOTE — Assessment & Plan Note (Signed)
It looks like he has a chronic perforation.  No tympanic membrane movement in either ear with Valsalva.  I presume the lack of tympanic membrane movement on the left is due to eustachian tube dysfunction related to recent viral infections.  He still could have eustachian tube dysfunction on the right side concurrently.  It is theoretically possible that he could have a completely clear membrane that has healed over the perforation.  Discussed that if he does not have return of adequate eustachian tube dysfunction in either ear then that would direct follow-up.  He can update me as needed.  He has history of ear infections in childhood.

## 2022-09-06 ENCOUNTER — Encounter: Payer: Self-pay | Admitting: *Deleted

## 2024-03-05 ENCOUNTER — Ambulatory Visit: Payer: Self-pay

## 2024-03-05 NOTE — Telephone Encounter (Signed)
 FYI Only or Action Required?: FYI only for provider.  Patient was last seen in primary care on 08/20/2022 by Cleatus Arlyss RAMAN, MD.  Called Nurse Triage reporting Knee Pain and Joint Swelling.  Symptoms began several weeks ago.  Interventions attempted: Rest, hydration, or home remedies.  Symptoms are: unchanged.  Triage Disposition: See PCP When Office is Open (Within 3 Days)  Patient/caregiver understands and will follow disposition?: Yes    Copied from CRM [id number]. Topic: Clinical - Red Word Triage >> Mar 05, 2024  2:01 PM Franky GRADE wrote: Red Word that prompted transfer to Nurse Triage: Patient is experiencing knee and ankle pain and mild swelling on the ankles. Reason for Disposition  [1] MILD swelling of both ankles (e.g., ankle joints look swollen; or bilateral mild pedal edema) AND [2] new-onset or getting worse  (Exceptions: Caused by hot weather, already seen by doctor or NP/PA for this.)  Answer Assessment - Initial Assessment Questions 1. LOCATION: Which ankle is swollen? Where is the swelling?     Bilateral  2. ONSET: When did the swelling start?     Intermittently ongoing  3. SWELLING: How bad is the swelling? Or, How large is it? (e.g., mild, moderate, severe; size of localized swelling)      Localized to feet from work  4. PAIN: Is there any pain? If Yes, ask: How bad is it? (Scale 0-10; or none, mild, moderate, severe)     Increasing  5. CAUSE: What do you think caused the ankle swelling?     Walking  6. OTHER SYMPTOMS: Do you have any other symptoms? (e.g., fever, chest pain, difficulty breathing, calf pain)     Bilateral knee pain 7. PREGNANCY: Is there any chance you are pregnant? When was your last menstrual period?  Protocols used: Ankle Swelling-A-AH

## 2024-03-05 NOTE — Telephone Encounter (Signed)
Will see at OV 

## 2024-03-08 ENCOUNTER — Ambulatory Visit: Admitting: Family Medicine

## 2024-03-08 ENCOUNTER — Encounter: Payer: Self-pay | Admitting: Family Medicine

## 2024-03-08 ENCOUNTER — Telehealth: Payer: Self-pay | Admitting: Family Medicine

## 2024-03-08 VITALS — BP 138/88 | HR 56 | Temp 98.7°F | Ht 70.0 in | Wt 216.8 lb

## 2024-03-08 DIAGNOSIS — Z13220 Encounter for screening for lipoid disorders: Secondary | ICD-10-CM | POA: Diagnosis not present

## 2024-03-08 DIAGNOSIS — Z659 Problem related to unspecified psychosocial circumstances: Secondary | ICD-10-CM

## 2024-03-08 DIAGNOSIS — R609 Edema, unspecified: Secondary | ICD-10-CM | POA: Diagnosis not present

## 2024-03-08 DIAGNOSIS — Z1211 Encounter for screening for malignant neoplasm of colon: Secondary | ICD-10-CM

## 2024-03-08 DIAGNOSIS — R600 Localized edema: Secondary | ICD-10-CM

## 2024-03-08 LAB — COMPREHENSIVE METABOLIC PANEL WITH GFR
ALT: 13 U/L (ref 0–53)
AST: 14 U/L (ref 0–37)
Albumin: 4.3 g/dL (ref 3.5–5.2)
Alkaline Phosphatase: 103 U/L (ref 39–117)
BUN: 18 mg/dL (ref 6–23)
CO2: 27 meq/L (ref 19–32)
Calcium: 9.5 mg/dL (ref 8.4–10.5)
Chloride: 106 meq/L (ref 96–112)
Creatinine, Ser: 0.82 mg/dL (ref 0.40–1.50)
GFR: 92.24 mL/min (ref >60.00)
Glucose, Bld: 102 mg/dL — ABNORMAL HIGH (ref 70–99)
Potassium: 4.5 meq/L (ref 3.5–5.1)
Sodium: 144 meq/L (ref 135–145)
Total Bilirubin: 0.4 mg/dL (ref 0.2–1.2)
Total Protein: 6.6 g/dL (ref 6.0–8.3)

## 2024-03-08 LAB — CBC WITH DIFFERENTIAL/PLATELET
Basophils Absolute: 0 K/uL (ref 0.0–0.1)
Basophils Relative: 0.7 % (ref 0.0–3.0)
Eosinophils Absolute: 0.2 K/uL (ref 0.0–0.7)
Eosinophils Relative: 3 % (ref 0.0–5.0)
HCT: 45 % (ref 39.0–52.0)
Hemoglobin: 15 g/dL (ref 13.0–17.0)
Lymphocytes Relative: 17.4 % (ref 12.0–46.0)
Lymphs Abs: 1.1 K/uL (ref 0.7–4.0)
MCHC: 33.4 g/dL (ref 30.0–36.0)
MCV: 92.2 fl (ref 78.0–100.0)
Monocytes Absolute: 0.5 K/uL (ref 0.1–1.0)
Monocytes Relative: 8.3 % (ref 3.0–12.0)
Neutro Abs: 4.3 K/uL (ref 1.4–7.7)
Neutrophils Relative %: 70.6 % (ref 43.0–77.0)
Platelets: 221 K/uL (ref 150.0–400.0)
RBC: 4.88 Mil/uL (ref 4.22–5.81)
RDW: 13.7 % (ref 11.5–15.5)
WBC: 6.1 K/uL (ref 4.0–10.5)

## 2024-03-08 LAB — LIPID PANEL
Cholesterol: 181 mg/dL (ref 0–200)
HDL: 40.9 mg/dL (ref >39.00)
LDL Cholesterol: 119 mg/dL — ABNORMAL HIGH (ref 0–99)
NonHDL: 140.01
Total CHOL/HDL Ratio: 4
Triglycerides: 106 mg/dL (ref 0.0–149.0)
VLDL: 21.2 mg/dL (ref 0.0–40.0)

## 2024-03-08 NOTE — Assessment & Plan Note (Signed)
 D/w pt about GI f/u.

## 2024-03-08 NOTE — Telephone Encounter (Signed)
Letter done.  Please give to patient

## 2024-03-08 NOTE — Assessment & Plan Note (Signed)
 He is going to need FMLA done for intermittent leave due to leg pain and swelling.  I asked him to send me the papers from work.  Discussed trying compression stocking in the meantime.  Suspect varicose veins and dependent positioning contribute.   See notes on labs.  We can refer to vascular if not better with compression stocking.

## 2024-03-08 NOTE — Telephone Encounter (Signed)
 Copied from CRM #[id number]. Topic: General - Other >> Mar 08, 2024 12:48 PM Robinson H wrote: Reason for CRM: Patient is calling to see if Dr. Cleatus can provide him with a letter to be off for about a week until he can get his FMLA paperwork so it can be completed. States he had appointment with provider today and he is aware of knee and stress issue with his work.  Dorian [phone]

## 2024-03-08 NOTE — Assessment & Plan Note (Signed)
 See notes on labs.
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
## 2024-03-08 NOTE — Telephone Encounter (Signed)
 Pt called back to follow-up. He stated that he needs it for work tomorrow. I informed him that I would addend to the message but could not guarantee that pcp can complete by today as he is hoping for. Please call pt with an update.

## 2024-03-08 NOTE — Patient Instructions (Signed)
 Go to the lab on the way out.   If you have mychart we'll likely use that to update you.    Take care.  Glad to see you. Let me know if you want to get set up with counseling.  I would try using a knee high compression stocking.  10-23mmHg compression.  If that isn't helping, then we can set you up with the vascular clinic.

## 2024-03-08 NOTE — Assessment & Plan Note (Signed)
 See above.   I asked him to let me know if he wants to get set up with counseling.

## 2024-03-09 ENCOUNTER — Telehealth: Payer: Self-pay | Admitting: Family Medicine

## 2024-03-09 NOTE — Telephone Encounter (Signed)
 error

## 2024-03-09 NOTE — Telephone Encounter (Signed)
Pt came by and picked up form

## 2024-03-09 NOTE — Telephone Encounter (Signed)
 Notified patient that letter is available for pick up at the front desk

## 2024-03-14 ENCOUNTER — Ambulatory Visit: Payer: Self-pay | Admitting: Family Medicine

## 2024-03-22 ENCOUNTER — Telehealth: Payer: Self-pay | Admitting: Family Medicine

## 2024-03-22 NOTE — Telephone Encounter (Signed)
 Placed ppw in basket on [name]'s desk.

## 2024-03-22 NOTE — Telephone Encounter (Signed)
 Type of forms received: [facility] medical leave   Routed to: [employer] received by : Randall     Individual made aware of 3-5 business day turn around (Y/N): Y   Form completed and patient made aware of charges(Y/N): Y       Form location:  provider's folder

## 2024-03-23 NOTE — Telephone Encounter (Signed)
 FMLA for Patient forms received for completion for patient. Patient has been informed that process may take up to 5 business days.  Employer Name [employer] Reason for being out: Varicose Veins  Any inpatient care:N/A Any Planned appointment? TBD Patient is requesting start date of 67.3.25 Patient is requesting end date of 42.3.25  Verified with patient that it is ok to leave Voicemail updates on  Mobile [phone] (mobile)  Patient would like to pick up copy in our office when form is completed.   Patient informed original forms will need to be picked up and given to employer, as no fax number was included with forms.  Forms placed in providers box for review.

## 2024-03-24 NOTE — Telephone Encounter (Signed)
 I will work on [employer].  Thanks.

## 2024-03-25 NOTE — Telephone Encounter (Signed)
 Placed ppw in box on [name]'s desk.

## 2024-03-25 NOTE — Telephone Encounter (Signed)
 Completed forms received and left at the front desk for patient to pick up to give to employer. A copy for the patient is also included. Patient notified and will pick up tomorrow 10.24.25.  Copy sent to scan.

## 2024-03-26 NOTE — Telephone Encounter (Signed)
 Pt picked up form

## 2024-04-02 ENCOUNTER — Telehealth: Payer: Self-pay | Admitting: Family Medicine

## 2024-04-02 NOTE — Telephone Encounter (Signed)
 Copied from CRM [id number]. 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 He doesn't know how long that could take. He's requesting a work note from 11/3-12/3.   Call back # [phone]

## 2024-04-05 NOTE — Telephone Encounter (Signed)
 Spoke with patient and he states he has all completed paperwork.

## 2024-04-07 ENCOUNTER — Telehealth: Payer: Self-pay | Admitting: Family Medicine

## 2024-04-07 NOTE — Telephone Encounter (Signed)
 Patient brought in FMLA ppwk back there is a highlighted section needing completion [employer] dates 11/3-12/3 need to be added and condition notes by provider advised patient they will receive call once complete

## 2024-04-07 NOTE — Telephone Encounter (Signed)
 I will work on [employer].  Thanks.

## 2024-04-07 NOTE — Telephone Encounter (Signed)
 Patient is asking for completed forms to be updated by provider per employer.   Part A section 3 needs to be dated 11.3-12.3 to reflect continuous leave needed.   Forms placed in providers box for review.

## 2024-04-07 NOTE — Telephone Encounter (Signed)
 I'll work on the hard copy.  Thanks.

## 2024-04-08 NOTE — Telephone Encounter (Unsigned)
 Copied from CRM [id number]. 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 He doesn't know how long that could take. He's requesting a work note from 11/3-12/3.   Call back # [phone]

## 2024-04-09 ENCOUNTER — Telehealth: Payer: Self-pay | Admitting: Family Medicine

## 2024-04-09 NOTE — Telephone Encounter (Signed)
 Copied from CRM [id number]. 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 He doesn't know how long that could take. He's requesting a work note from 11/3-12/3.   Call back # [phone]

## 2024-04-09 NOTE — Telephone Encounter (Signed)
 I will work on the hard copy when possible.  Thanks.

## 2024-04-09 NOTE — Telephone Encounter (Signed)
 I am working on this and should have it done by Monday.  Thanks.

## 2024-04-11 NOTE — Telephone Encounter (Signed)
 FMLA forms done.  Work note done.  Please make sure it printed.  Thanks.

## 2024-04-12 NOTE — Telephone Encounter (Unsigned)
 Copied from CRM [id number]. 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 He doesn't know how long that could take. He's requesting a work note from 11/3-12/3.   Call back # [phone] >> Apr 12, 2024 10:35 AM Macario HERO wrote: Patient called to see if his FMLA forms were completed and faxed. Advised it was just completed yesterday and should be faxed. Requesting follow up when completed. >> Apr 08, 2024  2:26 PM Roselie BROCKS wrote: Patient calling for update if FMLA paperwork is complete. And requesting  a return call once complete.

## 2024-04-12 NOTE — Telephone Encounter (Signed)
 Paperwork along with work note received and faxed to [phone]  Copy sent to scan  Pt notified via phone, will pick up copy at the front desk

## 2024-04-12 NOTE — Telephone Encounter (Signed)
 Patient arrived in office to pick up ppwk

## 2024-05-03 ENCOUNTER — Telehealth: Payer: Self-pay | Admitting: Family Medicine

## 2024-05-03 NOTE — Telephone Encounter (Signed)
 Phone call to pt to follow up on message from front desk.  Patient is requesting that his FMLA be extended for 1 more month.  He denies back pain and reports continued leg pain and swelling.  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  Please advise if this can be done and if the patient needs an appointment.

## 2024-05-03 NOTE — Telephone Encounter (Signed)
 Pt came in and stated that he is still having back pain and would like for Dr cleatus to extend his leave from work pt state that he is in a lot of pain . I talk to Dr Cleatus he told me to triage the pt I called the triage line was on hold for a long time and ashely told me to send the MRN and she will look at his cart I informed pt that Nurse would call him

## 2024-05-04 ENCOUNTER — Ambulatory Visit: Admitting: Family Medicine

## 2024-05-04 NOTE — Telephone Encounter (Unsigned)
 Copied from CRM #[id number]. Topic: General - Other >> May 04, 2024 10:10 AM Larissa S wrote: Reason for CRM: Patient is requesting a work excuse. He states he needs to have it completed today. Requesting a callback once ready for pickup.

## 2024-05-04 NOTE — Telephone Encounter (Unsigned)
 Copied from CRM #[id number]. Topic: General - Other >> May 04, 2024 10:10 AM Larissa S wrote: Reason for CRM: Patient is requesting a work excuse. He states he needs to have it completed today. Requesting a callback once ready for pickup. >> May 04, 2024  3:20 PM Thersia C wrote: Patient is calling in regarding following up with the letter, would like a callback once it is completed

## 2024-05-04 NOTE — Telephone Encounter (Signed)
 Please have him submit FMLA papers and we'll work on them.  Did he improve with compression stockings?   Please see about scheduling a physical in early 2026.

## 2024-05-04 NOTE — Telephone Encounter (Unsigned)
 Copied from CRM #[id number]. Topic: General - Call Back - No Documentation >> May 03, 2024  4:48 PM Rea ORN wrote: Reason for CRM: Pt calling to follow up on request for FMLA. I advised pt the message was sent to [facility] high priority and we are awaiting response.   Please call back [phone]. Pt stated he will pick up form once completed.

## 2024-05-05 NOTE — Telephone Encounter (Signed)
 Left message to return call to office. Work Note is ready for pick up at the front office Ok to [employer] one below.

## 2024-05-05 NOTE — Telephone Encounter (Signed)
 I cannot guarantee that we can process paperwork on the same day of the request.  Did he improve with compression stockings?  Please let me know.  Please have him submit FMLA papers for us  to address.  I wrote him a work note for the next week to allow him time to submit the [facility] paperwork.  He needs to give us  several business days to address his FMLA paperwork.  Please make sure the letter printed and sent to patient.  Thanks.

## 2024-05-05 NOTE — Telephone Encounter (Signed)
 Patient arrived in office to pick up letter
